# Patient Record
Sex: Male | Born: 1980 | Race: Black or African American | Hispanic: No | Marital: Single | Smoking: Current every day smoker
Health system: Southern US, Community
[De-identification: ages and names within clinical notes are randomized; demographics above are authoritative.]

## PROBLEM LIST (undated history)

## (undated) DIAGNOSIS — B20 Human immunodeficiency virus [HIV] disease: Secondary | ICD-10-CM

## (undated) DIAGNOSIS — F319 Bipolar disorder, unspecified: Secondary | ICD-10-CM

## (undated) DIAGNOSIS — F209 Schizophrenia, unspecified: Secondary | ICD-10-CM

## (undated) DIAGNOSIS — G8190 Hemiplegia, unspecified affecting unspecified side: Secondary | ICD-10-CM

## (undated) DIAGNOSIS — G8929 Other chronic pain: Secondary | ICD-10-CM

## (undated) DIAGNOSIS — M549 Dorsalgia, unspecified: Secondary | ICD-10-CM

## (undated) DIAGNOSIS — R269 Unspecified abnormalities of gait and mobility: Secondary | ICD-10-CM

## (undated) DIAGNOSIS — Z21 Asymptomatic human immunodeficiency virus [HIV] infection status: Secondary | ICD-10-CM

## (undated) HISTORY — PX: ANKLE SURGERY: SHX546

## (undated) HISTORY — DX: Unspecified abnormalities of gait and mobility: R26.9

## (undated) HISTORY — DX: Hemiplegia, unspecified affecting unspecified side: G81.90

## (undated) HISTORY — DX: Asymptomatic human immunodeficiency virus (hiv) infection status: Z21

## (undated) HISTORY — DX: Human immunodeficiency virus (HIV) disease: B20

## (undated) HISTORY — PX: BACK SURGERY: SHX140

---

## 2003-06-07 ENCOUNTER — Emergency Department (HOSPITAL_COMMUNITY): Admission: EM | Admit: 2003-06-07 | Discharge: 2003-06-07 | Payer: Self-pay | Admitting: Emergency Medicine

## 2003-06-07 ENCOUNTER — Encounter: Payer: Self-pay | Admitting: Emergency Medicine

## 2009-06-07 ENCOUNTER — Ambulatory Visit: Payer: Self-pay | Admitting: Family Medicine

## 2009-07-04 ENCOUNTER — Encounter: Payer: Self-pay | Admitting: Internal Medicine

## 2009-07-04 ENCOUNTER — Encounter (INDEPENDENT_AMBULATORY_CARE_PROVIDER_SITE_OTHER): Payer: Self-pay | Admitting: *Deleted

## 2009-07-04 ENCOUNTER — Ambulatory Visit: Payer: Self-pay | Admitting: Internal Medicine

## 2009-07-04 LAB — CONVERTED CEMR LAB
ALT: 36 units/L (ref 0–53)
AST: 25 units/L (ref 0–37)
Albumin: 4.1 g/dL (ref 3.5–5.2)
CD4 T Helper %: 39 % (ref 32–62)
Calcium: 9.2 mg/dL (ref 8.4–10.5)
Chloride: 109 meq/L (ref 96–112)
Creatinine, Ser: 1.14 mg/dL (ref 0.40–1.50)
GC Probe Amp, Urine: NEGATIVE
HIV-1 RNA Quant, Log: <1.68 (ref <1.68)
Lymphocytes Relative: 42 % (ref 12–46)
Lymphs Abs: 1.9 10*3/uL (ref 0.7–4.0)
Neutrophils Relative %: 42 % — ABNORMAL LOW (ref 43–77)
Platelets: 239 10*3/uL (ref 150–400)
Potassium: 4.1 meq/L (ref 3.5–5.3)
Total lymphocyte count: 1848 cells/mcL (ref 700–3300)
WBC: 4.4 10*3/uL (ref 4.0–10.5)

## 2009-07-20 ENCOUNTER — Ambulatory Visit: Payer: Self-pay | Admitting: Internal Medicine

## 2009-07-20 ENCOUNTER — Encounter: Payer: Self-pay | Admitting: Internal Medicine

## 2009-07-20 DIAGNOSIS — G8190 Hemiplegia, unspecified affecting unspecified side: Secondary | ICD-10-CM | POA: Insufficient documentation

## 2009-07-20 DIAGNOSIS — B20 Human immunodeficiency virus [HIV] disease: Secondary | ICD-10-CM

## 2009-08-01 ENCOUNTER — Encounter: Payer: Self-pay | Admitting: Internal Medicine

## 2009-08-02 ENCOUNTER — Encounter: Payer: Self-pay | Admitting: Internal Medicine

## 2009-11-09 ENCOUNTER — Encounter: Payer: Self-pay | Admitting: Internal Medicine

## 2009-11-09 ENCOUNTER — Ambulatory Visit: Payer: Self-pay | Admitting: Family Medicine

## 2009-11-09 ENCOUNTER — Telehealth: Payer: Self-pay | Admitting: Internal Medicine

## 2009-11-09 LAB — CONVERTED CEMR LAB
Basophils Absolute: 0 10*3/uL (ref 0.0–0.1)
Basophils Relative: 0 % (ref 0–1)
Eosinophils Absolute: 0.1 10*3/uL (ref 0.0–0.7)
Eosinophils Relative: 2 % (ref 0–5)
GC Probe Amp, Genital: NEGATIVE
HCT: 45 % (ref 39.0–52.0)
Lymphocytes Relative: 24 % (ref 12–46)
MCHC: 33.3 g/dL (ref 30.0–36.0)
MCV: 97.2 fL (ref 78.0–100.0)
Platelets: 221 10*3/uL (ref 150–400)
RDW: 12.9 % (ref 11.5–15.5)

## 2009-11-16 ENCOUNTER — Ambulatory Visit: Payer: Self-pay | Admitting: Internal Medicine

## 2009-11-16 ENCOUNTER — Encounter: Payer: Self-pay | Admitting: Internal Medicine

## 2009-11-16 DIAGNOSIS — F17200 Nicotine dependence, unspecified, uncomplicated: Secondary | ICD-10-CM | POA: Insufficient documentation

## 2009-11-16 LAB — CONVERTED CEMR LAB
AST: 25 units/L (ref 0–37)
Albumin: 4 g/dL (ref 3.5–5.2)
Alkaline Phosphatase: 57 units/L (ref 39–117)
BUN: 17 mg/dL (ref 6–23)
Basophils Absolute: 0 10*3/uL (ref 0.0–0.1)
CD4 T Helper %: 37 % (ref 32–62)
Calcium: 9.2 mg/dL (ref 8.4–10.5)
Creatinine, Ser: 1.16 mg/dL (ref 0.40–1.50)
Eosinophils Absolute: 0.1 10*3/uL (ref 0.0–0.7)
Eosinophils Relative: 2 % (ref 0–5)
Glucose, Bld: 98 mg/dL (ref 70–99)
HCT: 44 % (ref 39.0–52.0)
HIV-1 RNA Quant, Log: 1.71 — ABNORMAL HIGH (ref <1.68)
Hemoglobin: 14.5 g/dL (ref 13.0–17.0)
MCHC: 33 g/dL (ref 30.0–36.0)
MCV: 99.3 fL (ref 78.0–100.0)
Monocytes Absolute: 0.6 10*3/uL (ref 0.1–1.0)
Platelets: 152 10*3/uL (ref 150–400)
RDW: 13.3 % (ref 11.5–15.5)
Total lymphocyte count: 2183 cells/mcL (ref 700–3300)

## 2009-11-30 ENCOUNTER — Encounter: Payer: Self-pay | Admitting: Internal Medicine

## 2009-11-30 ENCOUNTER — Ambulatory Visit: Payer: Self-pay | Admitting: Internal Medicine

## 2009-11-30 DIAGNOSIS — L0233 Carbuncle of buttock: Secondary | ICD-10-CM

## 2010-01-05 ENCOUNTER — Encounter: Payer: Self-pay | Admitting: Internal Medicine

## 2010-04-03 ENCOUNTER — Telehealth: Payer: Self-pay | Admitting: Internal Medicine

## 2010-04-05 ENCOUNTER — Ambulatory Visit: Payer: Self-pay | Admitting: Internal Medicine

## 2010-04-05 LAB — CONVERTED CEMR LAB
Albumin: 4.4 g/dL (ref 3.5–5.2)
BUN: 15 mg/dL (ref 6–23)
Basophils Absolute: 0 10*3/uL (ref 0.0–0.1)
Calcium: 9.1 mg/dL (ref 8.4–10.5)
Chloride: 106 meq/L (ref 96–112)
Eosinophils Relative: 1 % (ref 0–5)
Glucose, Bld: 94 mg/dL (ref 70–99)
HIV-1 RNA Quant, Log: <1.68 (ref <1.68)
Lymphocytes Relative: 43 % (ref 12–46)
Lymphs Abs: 2.2 10*3/uL (ref 0.7–4.0)
Monocytes Absolute: 0.6 10*3/uL (ref 0.1–1.0)
Potassium: 3.9 meq/L (ref 3.5–5.3)
RBC: 4.4 M/uL (ref 4.22–5.81)
RDW: 13.3 % (ref 11.5–15.5)
WBC: 5 10*3/uL (ref 4.0–10.5)

## 2010-04-07 ENCOUNTER — Emergency Department (HOSPITAL_COMMUNITY): Admission: EM | Admit: 2010-04-07 | Discharge: 2010-04-07 | Payer: Self-pay | Admitting: Family Medicine

## 2010-05-08 ENCOUNTER — Emergency Department (HOSPITAL_COMMUNITY): Admission: EM | Admit: 2010-05-08 | Discharge: 2010-05-08 | Payer: Self-pay | Admitting: Emergency Medicine

## 2010-06-06 ENCOUNTER — Encounter: Payer: Self-pay | Admitting: Internal Medicine

## 2010-07-26 ENCOUNTER — Ambulatory Visit: Payer: Self-pay | Admitting: Internal Medicine

## 2010-07-26 LAB — CONVERTED CEMR LAB
ALT: 23 units/L (ref 0–53)
Albumin: 4.5 g/dL (ref 3.5–5.2)
Alkaline Phosphatase: 71 units/L (ref 39–117)
Bilirubin Urine: NEGATIVE
CO2: 25 meq/L (ref 19–32)
Chlamydia, Swab/Urine, PCR: NEGATIVE
Eosinophils Absolute: 0.1 10*3/uL (ref 0.0–0.7)
Glucose, Bld: 95 mg/dL (ref 70–99)
HIV-1 RNA Quant, Log: <1.3 (ref <1.30)
Ketones, ur: NEGATIVE mg/dL
Lymphocytes Relative: 43 % (ref 12–46)
Lymphs Abs: 2.3 10*3/uL (ref 0.7–4.0)
Neutrophils Relative %: 42 % — ABNORMAL LOW (ref 43–77)
Platelets: 243 10*3/uL (ref 150–400)
Potassium: 4.4 meq/L (ref 3.5–5.3)
Sodium: 141 meq/L (ref 135–145)
Specific Gravity, Urine: 1.023 (ref 1.005–1.030)
Total Protein: 7.2 g/dL (ref 6.0–8.3)
Urine Glucose: NEGATIVE mg/dL
Urobilinogen, UA: 0.2 (ref 0.0–1.0)
WBC: 5.4 10*3/uL (ref 4.0–10.5)

## 2010-08-29 ENCOUNTER — Ambulatory Visit: Payer: Self-pay | Admitting: Adult Health

## 2010-08-29 DIAGNOSIS — J329 Chronic sinusitis, unspecified: Secondary | ICD-10-CM | POA: Insufficient documentation

## 2010-09-03 ENCOUNTER — Emergency Department (HOSPITAL_COMMUNITY): Admission: EM | Admit: 2010-09-03 | Discharge: 2010-09-03 | Payer: Self-pay | Admitting: Emergency Medicine

## 2010-12-11 NOTE — Assessment & Plan Note (Signed)
Summary: Joel pt. checkup [mkj]   CC:  follow-up visit and lab results.  History of Present Illness: Pt here for f/u.  His muscle spasms have continued on the Baclofen that the Neurologist gave him.  He needs to call for a f/u appt to discuss other options.  Preventive Screening-Counseling & Management  Alcohol-Tobacco     Alcohol drinks/day: occasional     Smoking Cessation Counseling: yes     Packs/Day: <0.25     Year Started: 2002     Passive Smoke Exposure: Yes     Passive Smoke Counseling: to avoid passive smoke exposure  Caffeine-Diet-Exercise     Caffeine use/day: soda and coffee     Does Patient Exercise: yes     Type of exercise: walking     Exercise (avg: min/session): >60     Times/week: 7  Safety-Violence-Falls     Seat Belt Use: yes      Sexual History:  n/a.        Drug Use:  Yes.    Comments: pt given condoms   Updated Prior Medication List: ATRIPLA 600-200-300 MG TABS (EFAVIRENZ-EMTRICITAB-TENOFOVIR) Take 1 tablet by mouth at bedtime VALTREX 1 GM TABS (VALACYCLOVIR HCL) Take 1 tablet by mouth once a day BACLOFEN 20 MG TABS (BACLOFEN) as directed by neurology  Current Allergies (reviewed today): No known allergies  Past History:  Past Medical History: Last updated: 07/20/2009 HIV disease  Social History: Sexual History:  n/a  Review of Systems  The patient denies anorexia, fever, and weight loss.    Vital Signs:  Patient profile:   30 year old male Height:      71 inches (180.34 cm) Weight:      229.12 pounds (104.15 kg) BMI:     32.07 Temp:     98.3 degrees F (36.83 degrees C) oral Pulse rate:   87 / minute BP sitting:   120 / 81  (right arm)  Vitals Entered By: Wendall Mola CMA Duncan Dull) (Apr 05, 2010 10:32 AM) CC: follow-up visit, lab results Is Patient Diabetic? No Pain Assessment Patient in pain? yes     Location: back and hip Intensity: 7 Type: sharp Onset of pain  Intermittent Nutritional Status BMI of 25 - 29 =  overweight Nutritional Status Detail appetite "up and down"  Have you ever been in a relationship where you felt threatened, hurt or afraid?No   Does patient need assistance? Functional Status Self care Ambulation Normal Comments pt. missed 5-6 doses of meds since last visit   Physical Exam  General:  alert, well-developed, and well-nourished.   Head:  normocephalic and atraumatic.   Mouth:  pharynx pink and moist.   Lungs:  normal breath sounds.          Medication Adherence: 04/05/2010   Adherence to medications reviewed with patient. Counseling to provide adequate adherence provided   Prevention For Positives: 04/05/2010   Safe sex practices discussed with patient. Condoms offered.                             Impression & Recommendations:  Problem # 1:  HIV DISEASE (ICD-042) Will obtain labs today and have him f/u in 2 weeks for results. Diagnostics Reviewed:  WBC: 5.9 (11/16/2009)   Hgb: 14.5 (11/16/2009)   HCT: 44.0 (11/16/2009)   Platelets: 152 (11/16/2009) HIV-1 RNA: 51 (11/16/2009)   HBSAg: NEG (07/04/2009)  Medications Added to Medication List This Visit: 1)  Baclofen 20 Mg Tabs (Baclofen) .... As directed by neurology  Other Orders: T-CD4SP [facility]) (CD4SP) T-HIV Viral Load [phone]) T-Comprehensive Metabolic Panel [phone]) T-CBC w/Diff ([phone]) Est. Patient Level III (02725)  Patient Instructions: 1)  Please schedule a follow-up appointment in 2 weeks.
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
## 2010-12-11 NOTE — Miscellaneous (Signed)
Summary: RW Update  Clinical Lists Changes  Observations: Added new observation of DATE1STVISIT: 04/05/2010 (06/06/2010 12:24) Added new observation of RWPARTICIP: Yes (06/06/2010 12:24)

## 2010-12-11 NOTE — Miscellaneous (Signed)
Summary: Lab orders  Clinical Lists Changes  Orders: Added new Test order of T-Chlamydia  Probe, urine [phone]) - Signed Added new Test order of T-CBC w/Diff [phone]) - Signed Added new Test order of T-GC Probe, urine [phone]) - Signed Added new Test order of T-Comprehensive Metabolic Panel [phone]) - Signed Added new Test order of T-HIV Viral Load [phone]) - Signed Added new Test order of T-Urinalysis ([id number]) - Signed Added new Test order of T-RPR (Syphilis) ([id number]) - Signed Added new Test order of T-CD4SP ([facility]) (CD4SP) - Signed

## 2010-12-11 NOTE — Assessment & Plan Note (Signed)
Summary: diarrahea X3wks votiming [mkj]   History of Present Illness: 1-week h/o sinus drainage with excessive "phlegm" in his throat started taking left-over amoxicillin (did not recall the dose), but ran out after three days. Still c/o sinus congestion and post nasal drainage.  Denies current fever, chills, or sweats.  Requesting update on immunizations and wanting lab reports from blood draw in 09/11.   Current Allergies: No known allergies  Past History:  Past medical, surgical, family and social histories (including risk factors) reviewed for relevance to current acute and chronic problems.  Past Medical History: Reviewed history from 07/20/2009 and no changes required. HIV disease  Family History: Reviewed history and no changes required.  Social History: Reviewed history and no changes required.  Review of Systems General:  Denies chills, fatigue, fever, loss of appetite, malaise, sleep disorder, sweats, weakness, and weight loss. Eyes:  Denies blurring, discharge, double vision, eye irritation, eye pain, halos, itching, light sensitivity, red eye, vision loss-1 eye, and vision loss-both eyes. ENT:  Complains of nasal congestion and postnasal drainage; denies decreased hearing, difficulty swallowing, ear discharge, earache, hoarseness, nosebleeds, ringing in ears, sinus pressure, and sore throat;  States was to have oral surgery on molars with extraction, missed appointment and says pain aggravates sinus symptoms.. CV:  Denies bluish discoloration of lips or nails, chest pain or discomfort, difficulty breathing at night, difficulty breathing while lying down, fainting, fatigue, leg cramps with exertion, lightheadness, near fainting, palpitations, shortness of breath with exertion, swelling of feet, swelling of hands, and weight gain. Resp:  Denies chest discomfort, chest pain with inspiration, cough, coughing up blood, excessive snoring, hypersomnolence, morning headaches,  pleuritic, shortness of breath, sputum productive, and wheezing. GI:  Denies abdominal pain, bloody stools, change in bowel habits, constipation, dark tarry stools, diarrhea, excessive appetite, gas, hemorrhoids, indigestion, loss of appetite, nausea, vomiting, vomiting blood, and yellowish skin color. GU:  Denies decreased libido, discharge, dysuria, erectile dysfunction, genital sores, hematuria, incontinence, nocturia, urinary frequency, and urinary hesitancy. Derm:  Denies changes in color of skin, changes in nail beds, dryness, excessive perspiration, flushing, hair loss, insect bite(s), itching, lesion(s), poor wound healing, and rash. Neuro:  Complains of weakness; denies brief paralysis, difficulty with concentration, disturbances in coordination, falling down, headaches, inability to speak, memory loss, numbness, poor balance, seizures, sensation of room spinning, tingling, tremors, and visual disturbances; Chronic left sided weakness.  Physical Exam  General:  alert, well-developed, well-hydrated, appropriate dress, normal appearance, healthy-appearing, cooperative to examination, good hygiene, and overweight-appearing.   Head:  Normocephalic and atraumatic without obvious abnormalities. No apparent alopecia or balding. Ears:  External ear exam shows no significant lesions or deformities.  Otoscopic examination reveals clear canals, tympanic membranes are intact bilaterally without bulging, retraction, inflammation or discharge. Hearing is grossly normal bilaterally. Nose:  External nasal examination shows no deformity or inflammation. Nasal mucosa are pink and moist without lesions or exudates. Mouth:  pharynx pink and moist, fair dentition, multiple caries and fillings, and postnasal drip.   Neck:  No deformities, masses, or tenderness noted. Chest Wall:  no deformities, no tenderness, and no masses.   Breasts:  gynecomastia.   Lungs:  Normal respiratory effort, chest expands  symmetrically. Lungs are clear to auscultation, no crackles or wheezes. Heart:  Normal rate and regular rhythm. S1 and S2 normal without gallop, murmur, click, rub or other extra sounds. Abdomen:  Bowel sounds positive,abdomen soft and non-tender without masses, organomegaly or hernias noted. Msk:  No deformity or scoliosis noted of thoracic  or lumbar spine.   Pulses:  R and L carotid,radial,femoral,dorsalis pedis and posterior tibial pulses are full and equal bilaterally Extremities:  No clubbing, cyanosis, edema, or deformity noted with normal full range of motion of all joints.   Neurologic:  alert & oriented X3, cranial nerves II-XII intact, strength normal in all extremities, sensation intact to light touch, sensation intact to pinprick, and gait normal.   Skin:  Intact without suspicious lesions or rashes Cervical Nodes:  [employer] A [employer] P Cervical nodes Psych:  Cognition and judgment appear intact. Alert and cooperative with normal attention span and concentration. No apparent delusions, illusions, hallucinations   Impression & Recommendations:  Problem # 1:  SINUSITIS (ICD-473.9) Resolving.  Will complete antibiotic therapy with Augmentin.  Enmcourage use of Mucinex and Zyrtec for sinus congestion relief. If fever develops, should contact clinic. His updated medication list for this problem includes:    Augmentin 875-125 Mg Tabs (Amoxicillin-pot clavulanate) .Marland Kitchen... 1 tab by mouth every 12 hours for 5 days    Mucinex 600 Mg Xr12h-tab (Guaifenesin) .Marland Kitchen... 1 tablet every 6 hours as needed  Problem # 2:  HIV DISEASE (ICD-042) CD4 690 @ 31%  VL  <20 copies/ml Lab report given to patient with instructions to continue follow-up with Dr. Drue Second. Repeat labs in December 2011 with f/u in January 2012. Plans to continue present regimen of Atripla. His updated medication list for this problem includes:    Valtrex 1 Gm Tabs (Valacyclovir hcl) .Marland Kitchen... Take 1 tablet by mouth once a day    Augmentin  875-125 Mg Tabs (Amoxicillin-pot clavulanate) .Marland Kitchen... 1 tab by mouth every 12 hours for 5 days  Medications Added to Medication List This Visit: 1)  Augmentin 875-125 Mg Tabs (Amoxicillin-pot clavulanate) .Marland Kitchen.. 1 tab by mouth every 12 hours for 5 days 2)  Mucinex 600 Mg Xr12h-tab (Guaifenesin) .Marland Kitchen.. 1 tablet every 6 hours as needed 3)  Zyrtec Allergy 10 Mg Caps (Cetirizine hcl) .Marland Kitchen.. 1 by mouth once daily as needed  Patient Instructions: 1)  Recommend increasing fluid intake for hydration for the next few days. 2)  Take antibiotics until completely gone 3)  May take OTC Zyrtec and Mucinex 4)  Reschedule follow-up with oral surgery 5)  Keep regular follow-up with Dr. Drue Second 6)  We will be holding flu vaccine until antibiotic course is complete. Prescriptions: ZYRTEC ALLERGY 10 MG CAPS (CETIRIZINE HCL) 1 by mouth once daily as needed  #50 x 0   Entered and Authorized by:   Talmadge Chad NP   Signed by:   Talmadge Chad NP on 08/29/2010   Method used:   Print then Give to Patient   RxID:   [id number] MUCINEX 600 MG XR12H-TAB (GUAIFENESIN) 1 tablet every 6 hours as needed  #50 x 0   Entered and Authorized by:   Talmadge Chad NP   Signed by:   Talmadge Chad NP on 08/29/2010   Method used:   Print then Give to Patient   RxID:   [id number] AUGMENTIN 875-125 MG TABS (AMOXICILLIN-POT CLAVULANATE) 1 tab by mouth every 12 hours for 5 days  #10 x 0   Entered and Authorized by:   Talmadge Chad NP   Signed by:   Talmadge Chad NP on 08/29/2010   Method used:   Print then Give to Patient   RxID:   [id number]
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
## 2010-12-11 NOTE — Letter (Signed)
Summary: [facility]  [facility]   Imported By: Percell Miller 01/05/2010 15:30:12  _____________________________________________________________________  External Attachment:    Type:   Image     Comment:   External Document

## 2010-12-11 NOTE — Progress Notes (Signed)
Summary: requests refill  Phone Note Call from Patient   Caller: Patient [phone] Reason for Call: Refill Medication Details for Reason: requests refill Summary of Call: pt. will be out of Atripla and Valtrex after this evening.  Has appt. this Thursday 04/05/10. Do you want to refill or wait until appt.? Initial call taken by: Wendall Mola CMA Duncan Dull),  Apr 03, 2010 12:36 PM  Follow-up for Phone Call        ok to refill Follow-up by: Yisroel Ramming MD,  Apr 03, 2010 3:50 PM    Prescriptions: ATRIPLA 600-200-300 MG TABS (EFAVIRENZ-EMTRICITAB-TENOFOVIR) Take 1 tablet by mouth at bedtime  #30 x 5   Entered by:   Wendall Mola CMA ( AAMA)   Authorized by:   Yisroel Ramming MD   Signed by:   Wendall Mola CMA ( AAMA) on 04/03/2010   Method used:   Telephoned to ...       [facility] (retail)       [address]       [place], [place]  [id number]       Ph: [phone]       Fax: [phone]   RxID:   [id number] VALTREX 1 GM TABS (VALACYCLOVIR HCL) Take 1 tablet by mouth once a day  #30 x 5   Entered by:   Wendall Mola CMA ( AAMA)   Authorized by:   Yisroel Ramming MD   Signed by:   Wendall Mola CMA ( AAMA) on 04/03/2010   Method used:   Telephoned to ...       [facility] (retail)       [address]       [place], [place]  [id number]       Ph: [phone]       Fax: [phone]   RxID:   [id number]

## 2010-12-11 NOTE — Miscellaneous (Signed)
  Clinical Lists Changes  Orders: Added new Service order of Est. Patient Level IV (99214) - Signed 

## 2010-12-11 NOTE — Miscellaneous (Signed)
Summary: HIPAA Restrictions  HIPAA Restrictions   Imported By: Florinda Marker 04/06/2010 11:03:28  _____________________________________________________________________  External Attachment:    Type:   Image     Comment:   External Document

## 2010-12-23 ENCOUNTER — Emergency Department (HOSPITAL_COMMUNITY): Payer: Medicare Other

## 2010-12-23 ENCOUNTER — Emergency Department (HOSPITAL_COMMUNITY)
Admission: EM | Admit: 2010-12-23 | Discharge: 2010-12-23 | Disposition: A | Discharge status: Home / Self Care | Payer: Medicare Other | Attending: Emergency Medicine | Admitting: Emergency Medicine

## 2010-12-23 DIAGNOSIS — X58XXXA Exposure to other specified factors, initial encounter: Secondary | ICD-10-CM | POA: Insufficient documentation

## 2010-12-23 DIAGNOSIS — F319 Bipolar disorder, unspecified: Secondary | ICD-10-CM | POA: Insufficient documentation

## 2010-12-23 DIAGNOSIS — Y929 Unspecified place or not applicable: Secondary | ICD-10-CM | POA: Insufficient documentation

## 2010-12-23 DIAGNOSIS — M79609 Pain in unspecified limb: Secondary | ICD-10-CM | POA: Insufficient documentation

## 2010-12-23 DIAGNOSIS — M7989 Other specified soft tissue disorders: Secondary | ICD-10-CM | POA: Insufficient documentation

## 2010-12-23 DIAGNOSIS — IMO0002 Reserved for concepts with insufficient information to code with codable children: Secondary | ICD-10-CM | POA: Insufficient documentation

## 2010-12-23 DIAGNOSIS — Z21 Asymptomatic human immunodeficiency virus [HIV] infection status: Secondary | ICD-10-CM | POA: Insufficient documentation

## 2010-12-23 DIAGNOSIS — L02619 Cutaneous abscess of unspecified foot: Secondary | ICD-10-CM | POA: Insufficient documentation

## 2010-12-23 DIAGNOSIS — L03039 Cellulitis of unspecified toe: Secondary | ICD-10-CM | POA: Insufficient documentation

## 2010-12-23 DIAGNOSIS — Z79899 Other long term (current) drug therapy: Secondary | ICD-10-CM | POA: Insufficient documentation

## 2011-01-08 ENCOUNTER — Encounter (INDEPENDENT_AMBULATORY_CARE_PROVIDER_SITE_OTHER): Payer: Self-pay | Admitting: *Deleted

## 2011-01-09 ENCOUNTER — Other Ambulatory Visit: Payer: Self-pay

## 2011-01-16 ENCOUNTER — Other Ambulatory Visit (INDEPENDENT_AMBULATORY_CARE_PROVIDER_SITE_OTHER): Payer: Medicare Other

## 2011-01-16 ENCOUNTER — Encounter: Payer: Self-pay | Admitting: Adult Health

## 2011-01-16 ENCOUNTER — Other Ambulatory Visit: Payer: Self-pay | Admitting: Adult Health

## 2011-01-16 ENCOUNTER — Encounter (INDEPENDENT_AMBULATORY_CARE_PROVIDER_SITE_OTHER): Payer: Self-pay | Admitting: Licensed Clinical Social Worker

## 2011-01-16 LAB — CONVERTED CEMR LAB
ALT: 20 units/L (ref 0–53)
AST: 24 units/L (ref 0–37)
Basophils Absolute: 0 10*3/uL (ref 0.0–0.1)
CO2: 22 meq/L (ref 19–32)
Calcium: 9.3 mg/dL (ref 8.4–10.5)
Chloride: 106 meq/L (ref 96–112)
Creatinine, Ser: 1.3 mg/dL (ref 0.40–1.50)
Eosinophils Absolute: 0.2 10*3/uL (ref 0.0–0.7)
Eosinophils Relative: 3 % (ref 0–5)
HCT: 46.1 % (ref 39.0–52.0)
HIV 1 RNA Quant: <20 copies/mL (ref <20)
HIV-1 RNA Quant, Log: <1.3 (ref <1.30)
Lymphs Abs: 2.2 10*3/uL (ref 0.7–4.0)
MCV: 97.9 fL (ref 78.0–100.0)
Neutrophils Relative %: 49 % (ref 43–77)
Platelets: 241 10*3/uL (ref 150–400)
RDW: 13.3 % (ref 11.5–15.5)
Sodium: 141 meq/L (ref 135–145)
Total Bilirubin: 0.5 mg/dL (ref 0.3–1.2)
Total Protein: 7 g/dL (ref 6.0–8.3)
WBC: 5.6 10*3/uL (ref 4.0–10.5)

## 2011-01-17 DIAGNOSIS — B20 Human immunodeficiency virus [HIV] disease: Secondary | ICD-10-CM

## 2011-01-17 LAB — T-HELPER CELL (CD4) - (RCID CLINIC ONLY)
CD4 % Helper T Cell: 35 % (ref 33–55)
CD4 T Cell Abs: 780 uL (ref 400–2700)

## 2011-01-17 NOTE — Miscellaneous (Signed)
Summary: medication update  Clinical Lists Changes

## 2011-01-22 ENCOUNTER — Ambulatory Visit: Payer: Self-pay | Admitting: Adult Health

## 2011-01-22 NOTE — Miscellaneous (Signed)
Summary: Orders Update  Clinical Lists Changes  Orders: Added new Test order of T-CD4SP [facility]) (CD4SP) - Signed Added new Test order of T-Comprehensive Metabolic Panel [phone]) - Signed Added new Test order of T-HIV Viral Load [phone]) - Signed

## 2011-01-23 LAB — BASIC METABOLIC PANEL
CO2: 23 mEq/L (ref 19–32)
Calcium: 9 mg/dL (ref 8.4–10.5)
Creatinine, Ser: 0.99 mg/dL (ref 0.4–1.5)
GFR calc Af Amer: >60 mL/min (ref >60)
GFR calc non Af Amer: >60 mL/min (ref >60)
Sodium: 137 mEq/L (ref 135–145)

## 2011-01-23 LAB — DIFFERENTIAL
Lymphocytes Relative: 33 % (ref 12–46)
Lymphs Abs: 2.1 10*3/uL (ref 0.7–4.0)
Monocytes Relative: 13 % — ABNORMAL HIGH (ref 3–12)
Neutro Abs: 3.3 10*3/uL (ref 1.7–7.7)
Neutrophils Relative %: 51 % (ref 43–77)

## 2011-01-23 LAB — CBC
Hemoglobin: 14.5 g/dL (ref 13.0–17.0)
Platelets: 238 10*3/uL (ref 150–400)
RBC: 4.63 MIL/uL (ref 4.22–5.81)
WBC: 6.4 10*3/uL (ref 4.0–10.5)

## 2011-01-24 LAB — T-HELPER CELL (CD4) - (RCID CLINIC ONLY)
CD4 % Helper T Cell: 31 % — ABNORMAL LOW (ref 33–55)
CD4 T Cell Abs: 690 uL (ref 400–2700)

## 2011-01-31 ENCOUNTER — Ambulatory Visit (INDEPENDENT_AMBULATORY_CARE_PROVIDER_SITE_OTHER): Payer: Medicare Other | Admitting: Adult Health

## 2011-01-31 ENCOUNTER — Encounter: Payer: Self-pay | Admitting: Adult Health

## 2011-01-31 ENCOUNTER — Ambulatory Visit: Payer: Self-pay | Admitting: Adult Health

## 2011-01-31 VITALS — BP 120/80 | HR 71 | Temp 98.3°F | Ht 71.0 in | Wt 221.0 lb

## 2011-01-31 DIAGNOSIS — B20 Human immunodeficiency virus [HIV] disease: Secondary | ICD-10-CM

## 2011-02-07 ENCOUNTER — Other Ambulatory Visit: Payer: Medicare Other

## 2011-03-13 NOTE — Progress Notes (Signed)
  Subjective:    Patient ID: Joel Smith, male    DOB: October 12, 1981, 30 y.o.   MRN: [id number]  HPI presents to clinic for followup. Remains adherent to his antiretrovirals. Has no specific complaints today.    Review of Systems  Constitutional: Negative.   HENT: Negative.   Eyes: Negative.   Respiratory: Negative.   Cardiovascular: Negative.   Gastrointestinal: Negative.   Genitourinary: Negative.   Musculoskeletal: Negative.   Skin: Negative.   Neurological: Negative.   Hematological: Negative.   Psychiatric/Behavioral: Negative.        Objective:   Physical Exam  Constitutional: He is oriented to person, place, and time. He appears well-developed and well-nourished. No distress.  HENT:  Head: Normocephalic and atraumatic.  Right Ear: External ear normal.  Left Ear: External ear normal.  Mouth/Throat: Oropharynx is clear and moist. No oropharyngeal exudate.  Eyes: Conjunctivae and EOM are normal. Pupils are equal, round, and reactive to light. Right eye exhibits no discharge. Left eye exhibits no discharge. No scleral icterus.  Neck: Neck supple. No JVD present. No tracheal deviation present. No thyromegaly present.  Cardiovascular: Normal rate, regular rhythm, normal heart sounds and intact distal pulses.  Exam reveals no gallop and no friction rub.   No murmur heard. Pulmonary/Chest: Smith sounds normal. No stridor. No respiratory distress. He has no wheezes. He has no rales. He exhibits no tenderness.  Abdominal: Soft. Bowel sounds are normal. He exhibits no distension and no mass. There is no tenderness. There is no rebound and no guarding.  Musculoskeletal: Normal range of motion. He exhibits no edema and no tenderness.  Lymphadenopathy:    He has no cervical adenopathy.  Neurological: He is alert and oriented to person, place, and time. No cranial nerve deficit. Coordination normal.  Skin: Skin is warm and dry. No rash noted. He is not diaphoretic.  Psychiatric: He  has a normal mood and affect. His behavior is normal. Judgment and thought content normal.          Assessment & Plan:  1. HIV. CD4 count from 01/16/2011 was 780 at 35% with a viral load of less than 20 copies per mL. Clinically stable on Atripla. Recommend continuing present management with repeat labs in 10 weeks and followup in 3 months. Verbally acknowledged this information and agreed with plan of care.

## 2011-04-09 ENCOUNTER — Other Ambulatory Visit: Payer: Self-pay | Admitting: *Deleted

## 2011-04-09 DIAGNOSIS — J383 Other diseases of vocal cords: Secondary | ICD-10-CM

## 2011-04-09 DIAGNOSIS — B977 Papillomavirus as the cause of diseases classified elsewhere: Secondary | ICD-10-CM | POA: Insufficient documentation

## 2011-04-09 MED ORDER — VALACYCLOVIR HCL 1 G PO TABS: 1000.0000 mg | ORAL_TABLET | Freq: Every day | ORAL | Status: DC

## 2011-04-17 ENCOUNTER — Other Ambulatory Visit: Payer: Medicare Other

## 2011-05-02 ENCOUNTER — Ambulatory Visit: Payer: Medicare Other | Admitting: Adult Health

## 2011-08-21 ENCOUNTER — Encounter: Payer: Self-pay | Admitting: *Deleted

## 2011-08-21 ENCOUNTER — Ambulatory Visit: Payer: Medicare Other | Admitting: *Deleted

## 2011-08-21 ENCOUNTER — Other Ambulatory Visit: Payer: Medicare Other

## 2011-08-21 VITALS — BP 111/78 | HR 74 | Temp 97.7°F

## 2011-08-21 DIAGNOSIS — B20 Human immunodeficiency virus [HIV] disease: Secondary | ICD-10-CM

## 2011-08-21 LAB — CBC WITH DIFFERENTIAL/PLATELET
Basophils Absolute: 0 10*3/uL (ref 0.0–0.1)
Basophils Relative: 0 % (ref 0–1)
Eosinophils Absolute: 0.2 10*3/uL (ref 0.0–0.7)
Eosinophils Relative: 3 % (ref 0–5)
HCT: 42.5 % (ref 39.0–52.0)
MCH: 31.7 pg (ref 26.0–34.0)
MCHC: 32.9 g/dL (ref 30.0–36.0)
MCV: 96.2 fL (ref 78.0–100.0)
Monocytes Absolute: 0.5 10*3/uL (ref 0.1–1.0)
Platelets: 237 10*3/uL (ref 150–400)
RDW: 13.1 % (ref 11.5–15.5)

## 2011-08-21 LAB — COMPREHENSIVE METABOLIC PANEL
ALT: 22 U/L (ref 0–53)
AST: 27 U/L (ref 0–37)
Alkaline Phosphatase: 71 U/L (ref 39–117)
BUN: 15 mg/dL (ref 6–23)
Calcium: 9.1 mg/dL (ref 8.4–10.5)
Chloride: 106 mEq/L (ref 96–112)
Creat: 1.3 mg/dL (ref 0.50–1.35)
Total Bilirubin: 0.3 mg/dL (ref 0.3–1.2)

## 2011-08-22 LAB — T-HELPER CELL (CD4) - (RCID CLINIC ONLY)
CD4 % Helper T Cell: 39 % (ref 33–55)
CD4 T Cell Abs: 960 uL (ref 400–2700)

## 2011-09-04 ENCOUNTER — Ambulatory Visit (INDEPENDENT_AMBULATORY_CARE_PROVIDER_SITE_OTHER): Payer: Medicare Other | Admitting: Infectious Diseases

## 2011-09-04 ENCOUNTER — Encounter: Payer: Self-pay | Admitting: Infectious Diseases

## 2011-09-04 DIAGNOSIS — J383 Other diseases of vocal cords: Secondary | ICD-10-CM

## 2011-09-04 DIAGNOSIS — Z113 Encounter for screening for infections with a predominantly sexual mode of transmission: Secondary | ICD-10-CM

## 2011-09-04 DIAGNOSIS — Z79899 Other long term (current) drug therapy: Secondary | ICD-10-CM

## 2011-09-04 DIAGNOSIS — B977 Papillomavirus as the cause of diseases classified elsewhere: Secondary | ICD-10-CM

## 2011-09-04 DIAGNOSIS — Z21 Asymptomatic human immunodeficiency virus [HIV] infection status: Secondary | ICD-10-CM

## 2011-09-04 DIAGNOSIS — B20 Human immunodeficiency virus [HIV] disease: Secondary | ICD-10-CM

## 2011-09-04 DIAGNOSIS — Z23 Encounter for immunization: Secondary | ICD-10-CM

## 2011-09-04 DIAGNOSIS — H539 Unspecified visual disturbance: Secondary | ICD-10-CM | POA: Insufficient documentation

## 2011-09-04 MED ORDER — EFAVIRENZ-EMTRICITAB-TENOFOVIR 600-200-300 MG PO TABS: 1.0000 | ORAL_TABLET | Freq: Every day | ORAL | Status: DC

## 2011-09-04 MED ORDER — VALACYCLOVIR HCL 1 G PO TABS: 1000.0000 mg | ORAL_TABLET | Freq: Every day | ORAL | Status: DC

## 2011-09-04 NOTE — Progress Notes (Signed)
Addended by: Wendall Mola A on: 09/04/2011 03:17 PM   Modules accepted: Orders

## 2011-09-04 NOTE — Assessment & Plan Note (Signed)
Will have him eval by ophtho.  

## 2011-09-04 NOTE — Progress Notes (Signed)
  Subjective:    Patient ID: Joel Smith, male    DOB: October 13, 1981, 30 y.o.   MRN: [id number]  HPI 30 yo M who tested HIV+ 01-2002. He had previously been treated with KLT/CBV and was changed to [place]. He had previously been cared for in [place]. Last CD4 960 and VL <20 (08-21-11).  States he has been seeing floaters in his vision for awhile. Wants to see ophtho.    Review of Systems  Constitutional: Negative for appetite change and unexpected weight change.  Eyes: Positive for visual disturbance.  Gastrointestinal: Negative for diarrhea and constipation.  Genitourinary: Negative for dysuria.       Objective:   Physical Exam  Constitutional: He appears well-developed and well-nourished.  Eyes: EOM are normal. Pupils are equal, round, and reactive to light.  Neck: Neck supple.  Cardiovascular: Normal rate, regular rhythm and normal heart sounds.   Pulmonary/Chest: Effort normal and Smith sounds normal.  Abdominal: Soft. Bowel sounds are normal. There is no tenderness.  Lymphadenopathy:    He has no cervical adenopathy.          Assessment & Plan:

## 2011-09-04 NOTE — Assessment & Plan Note (Signed)
He is doing well, his partner is HIV+ but not on rx. He states they are not using meds. He is given condoms. Gets flu shot today. Will see him back in 4-5 months with labs prior.

## 2011-09-05 LAB — LIPID PANEL
LDL Cholesterol: 131 mg/dL — ABNORMAL HIGH (ref 0–99)
Triglycerides: 75 mg/dL (ref <150)
VLDL: 15 mg/dL (ref 0–40)

## 2011-11-16 ENCOUNTER — Emergency Department (HOSPITAL_COMMUNITY)
Admission: EM | Admit: 2011-11-16 | Discharge: 2011-11-16 | Disposition: A | Discharge status: Home / Self Care | Payer: Medicare Other | Attending: Emergency Medicine | Admitting: Emergency Medicine

## 2011-11-16 ENCOUNTER — Encounter (HOSPITAL_COMMUNITY): Payer: Self-pay | Admitting: Emergency Medicine

## 2011-11-16 DIAGNOSIS — S61209A Unspecified open wound of unspecified finger without damage to nail, initial encounter: Secondary | ICD-10-CM | POA: Diagnosis not present

## 2011-11-16 DIAGNOSIS — Z79899 Other long term (current) drug therapy: Secondary | ICD-10-CM | POA: Diagnosis not present

## 2011-11-16 DIAGNOSIS — Z21 Asymptomatic human immunodeficiency virus [HIV] infection status: Secondary | ICD-10-CM | POA: Diagnosis not present

## 2011-11-16 DIAGNOSIS — S61219A Laceration without foreign body of unspecified finger without damage to nail, initial encounter: Secondary | ICD-10-CM

## 2011-11-16 DIAGNOSIS — W269XXA Contact with unspecified sharp object(s), initial encounter: Secondary | ICD-10-CM | POA: Insufficient documentation

## 2011-11-16 MED ORDER — TETANUS-DIPHTH-ACELL PERTUSSIS 5-2.5-18.5 LF-MCG/0.5 IM SUSP
0.5000 mL | Freq: Once | INTRAMUSCULAR | Status: AC
  Administered 2011-11-16: 0.5 mL via INTRAMUSCULAR
  Filled 2011-11-16: qty 0.5

## 2011-11-16 MED ORDER — OXYCODONE-ACETAMINOPHEN 5-325 MG PO TABS: 1.0000 | ORAL_TABLET | Freq: Four times a day (QID) | ORAL | Status: AC | PRN

## 2011-11-16 NOTE — ED Notes (Signed)
Rx x 2, pt voiced understanding to f/u with PCP, states he has appt on the 9th of this month

## 2011-11-16 NOTE — ED Provider Notes (Signed)
History     CSN: [id number]  Arrival date & time 11/16/11  0024   First MD Initiated Contact with Patient 11/16/11 [phone]      Chief Complaint  Patient presents with  . Laceration    (Consider location/radiation/quality/duration/timing/severity/associated sxs/prior treatment) Patient is a 31 y.o. male presenting with skin laceration. The history is provided by the patient.  Laceration    patient states he cut his left index finger while cutting a bagel chips. His continued to bleed. No other injury. He states it hurts a lot. He does not know his last tetanus shot was. No numbness.  Past Medical History  Diagnosis Date  . HIV infection     History reviewed. No pertinent past surgical history.  Family History  Problem Relation Age of Onset  . Cancer Mother     breast cancer  . Hypertension Father     History  Substance Use Topics  . Smoking status: Current Everyday Smoker -- 0.5 packs/day for 12 years    Types: Cigarettes  . Smokeless tobacco: Never Used  . Alcohol Use: 1.0 oz/week    2 drink(s) per week     occasional      Review of Systems  Musculoskeletal:       Laceration to finger  Skin:       Laceration to finger  Neurological: Negative for numbness.  Hematological: Negative for adenopathy. Does not bruise/bleed easily.    Allergies  Pineapple  Home Medications   Current Outpatient Rx  Name Route Sig Dispense Refill  . BENZTROPINE MESYLATE 0.5 MG PO TABS Oral Take 0.5 mg by mouth 2 (two) times daily. Mental health     . CETIRIZINE HCL 10 MG PO CAPS Oral Take 1 capsule by mouth daily.      . EFAVIRENZ-EMTRICITAB-TENOFOVIR 600-200-300 MG PO TABS Oral Take 1 tablet by mouth at bedtime. 90 tablet 3  . GUAIFENESIN ER 600 MG PO TB12 Oral Take 600 mg by mouth every 6 (six) hours as needed. For congestion    . OXYCODONE-ACETAMINOPHEN 10-325 MG PO TABS Oral Take 1 tablet by mouth every 8 (eight) hours as needed. PCP prescribes     . VALACYCLOVIR HCL 1 G PO  TABS Oral Take 1 tablet (1,000 mg total) by mouth daily. 30 tablet 5  . ZIPRASIDONE HCL 60 MG PO CAPS Oral Take 60 mg by mouth 2 (two) times daily with a meal. Mental health     . OXYCODONE-ACETAMINOPHEN 5-325 MG PO TABS Oral Take 1-2 tablets by mouth every 6 (six) hours as needed for pain. 6 tablet 0    BP 130/88  Pulse 70  Temp(Src) 98.1 F (36.7 C) (Oral)  Resp 17  SpO2 100%  Physical Exam  Constitutional: He appears well-developed and well-nourished.  Musculoskeletal:       1 cm laceration to the lower aspect of had a distal phalanx of left index finger. Sensation intact distally flexion and extension intact.  Neurological: He is alert.    ED Course  LACERATION REPAIR Date/Time: 11/16/2011 6:00 AM Performed by: Benjiman Core R. Authorized by: Billee Cashing Consent: Verbal consent obtained. Written consent not obtained. Risks and benefits: risks, benefits and alternatives were discussed Consent given by: patient Patient understanding: patient states understanding of the procedure being performed Patient consent: the patient's understanding of the procedure matches consent given Procedure consent: procedure consent matches procedure scheduled Relevant documents: relevant documents present and verified Test results: test results available and properly labeled Site marked:  the operative site was marked Required items: required blood products, implants, devices, and special equipment available Patient identity confirmed: verbally with patient Time out: Immediately prior to procedure a "time out" was called to verify the correct patient, procedure, equipment, support staff and site/side marked as required. Body area: upper extremity Location details: left index finger Laceration length: 1 cm Tendon involvement: none Nerve involvement: none Vascular damage: no Anesthesia: local infiltration and digital block Local anesthetic: lidocaine 2% without  epinephrine Anesthetic total: 3 ml Patient sedated: no Preparation: Patient was prepped and draped in the usual sterile fashion. Skin closure: 5-0 nylon Number of sutures: 3 Technique: simple Approximation: close Approximation difficulty: simple Dressing: 4x4 sterile gauze and antibiotic ointment Patient tolerance: Patient tolerated the procedure well with no immediate complications.   (including critical care time)  Labs Reviewed - No data to display No results found.   1. Finger laceration       MDM  Laceration on finger without tendon involvement. Wound closed. Patient requesting pain medication.         Juliet Rude. Rubin Payor, MD 11/16/11 6045

## 2011-11-16 NOTE — ED Notes (Signed)
Pt. Presents with laceration at left index finger , accidentally sliced with knife while cooking.  Dressing applied prior to arrival .

## 2011-11-20 DIAGNOSIS — I10 Essential (primary) hypertension: Secondary | ICD-10-CM | POA: Diagnosis not present

## 2011-11-20 DIAGNOSIS — M549 Dorsalgia, unspecified: Secondary | ICD-10-CM | POA: Diagnosis not present

## 2011-11-20 DIAGNOSIS — L089 Local infection of the skin and subcutaneous tissue, unspecified: Secondary | ICD-10-CM | POA: Diagnosis not present

## 2011-11-20 DIAGNOSIS — F411 Generalized anxiety disorder: Secondary | ICD-10-CM | POA: Diagnosis not present

## 2011-12-03 ENCOUNTER — Encounter (HOSPITAL_COMMUNITY): Payer: Self-pay | Admitting: Emergency Medicine

## 2011-12-03 ENCOUNTER — Emergency Department (INDEPENDENT_AMBULATORY_CARE_PROVIDER_SITE_OTHER)
Admission: EM | Admit: 2011-12-03 | Discharge: 2011-12-03 | Disposition: A | Discharge status: Home / Self Care | Payer: Medicare Other | Source: Home / Self Care | Attending: Family Medicine | Admitting: Family Medicine

## 2011-12-03 DIAGNOSIS — G8929 Other chronic pain: Secondary | ICD-10-CM | POA: Diagnosis not present

## 2011-12-03 HISTORY — DX: Dorsalgia, unspecified: M54.9

## 2011-12-03 HISTORY — DX: Other chronic pain: G89.29

## 2011-12-03 HISTORY — DX: Bipolar disorder, unspecified: F31.9
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
## 2011-12-10 DIAGNOSIS — F319 Bipolar disorder, unspecified: Secondary | ICD-10-CM | POA: Diagnosis not present

## 2011-12-18 DIAGNOSIS — G8929 Other chronic pain: Secondary | ICD-10-CM | POA: Diagnosis not present

## 2011-12-18 DIAGNOSIS — F411 Generalized anxiety disorder: Secondary | ICD-10-CM | POA: Diagnosis not present

## 2011-12-26 DIAGNOSIS — G8110 Spastic hemiplegia affecting unspecified side: Secondary | ICD-10-CM | POA: Diagnosis not present

## 2012-01-15 ENCOUNTER — Other Ambulatory Visit: Payer: Medicare Other

## 2012-01-29 ENCOUNTER — Telehealth: Payer: Self-pay | Admitting: *Deleted

## 2012-01-29 ENCOUNTER — Ambulatory Visit: Payer: Medicare Other | Admitting: Infectious Diseases

## 2012-01-29 NOTE — Telephone Encounter (Signed)
When he returns my call, he needs to reschedule the appt he missed today

## 2012-02-14 DIAGNOSIS — B20 Human immunodeficiency virus [HIV] disease: Secondary | ICD-10-CM | POA: Diagnosis not present

## 2012-02-14 DIAGNOSIS — M545 Low back pain: Secondary | ICD-10-CM | POA: Diagnosis not present

## 2012-02-14 DIAGNOSIS — J45909 Unspecified asthma, uncomplicated: Secondary | ICD-10-CM | POA: Diagnosis not present

## 2012-02-14 DIAGNOSIS — F411 Generalized anxiety disorder: Secondary | ICD-10-CM | POA: Diagnosis not present

## 2012-03-13 DIAGNOSIS — B20 Human immunodeficiency virus [HIV] disease: Secondary | ICD-10-CM | POA: Diagnosis not present

## 2012-03-13 DIAGNOSIS — F411 Generalized anxiety disorder: Secondary | ICD-10-CM | POA: Diagnosis not present

## 2012-03-13 DIAGNOSIS — J45909 Unspecified asthma, uncomplicated: Secondary | ICD-10-CM | POA: Diagnosis not present

## 2012-03-24 DIAGNOSIS — G8110 Spastic hemiplegia affecting unspecified side: Secondary | ICD-10-CM | POA: Diagnosis not present

## 2012-04-07 ENCOUNTER — Ambulatory Visit: Payer: Medicare Other | Admitting: Physical Therapy

## 2012-04-08 ENCOUNTER — Ambulatory Visit: Payer: Medicare Other | Attending: Neurology | Admitting: Physical Therapy

## 2012-04-08 DIAGNOSIS — R269 Unspecified abnormalities of gait and mobility: Secondary | ICD-10-CM | POA: Insufficient documentation

## 2012-04-08 DIAGNOSIS — IMO0001 Reserved for inherently not codable concepts without codable children: Secondary | ICD-10-CM | POA: Diagnosis not present

## 2012-04-11 IMAGING — CR DG TOE GREAT 2+V*L*
3 series · 3 of 3 positions shown · non-contrast
Comparison: None

CLINICAL DATA: Left great toe injury and pain.

LEFT TOE - 2+ VIEW

[t toes ap left]
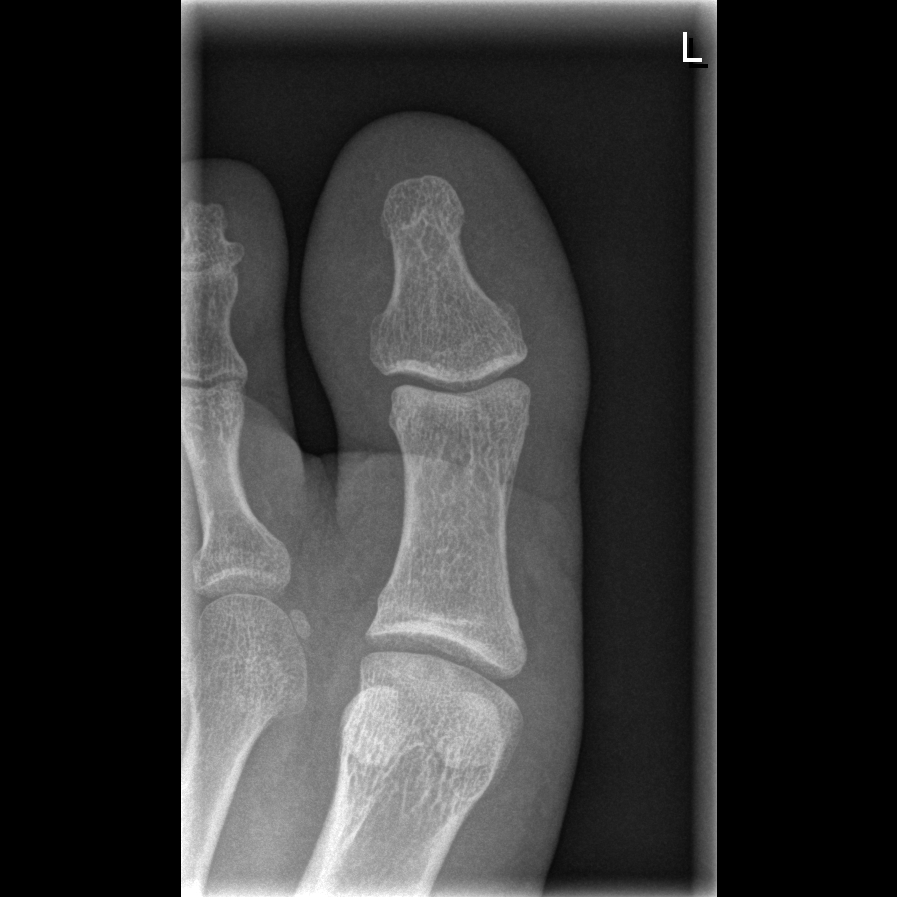

[t toes oblique left]
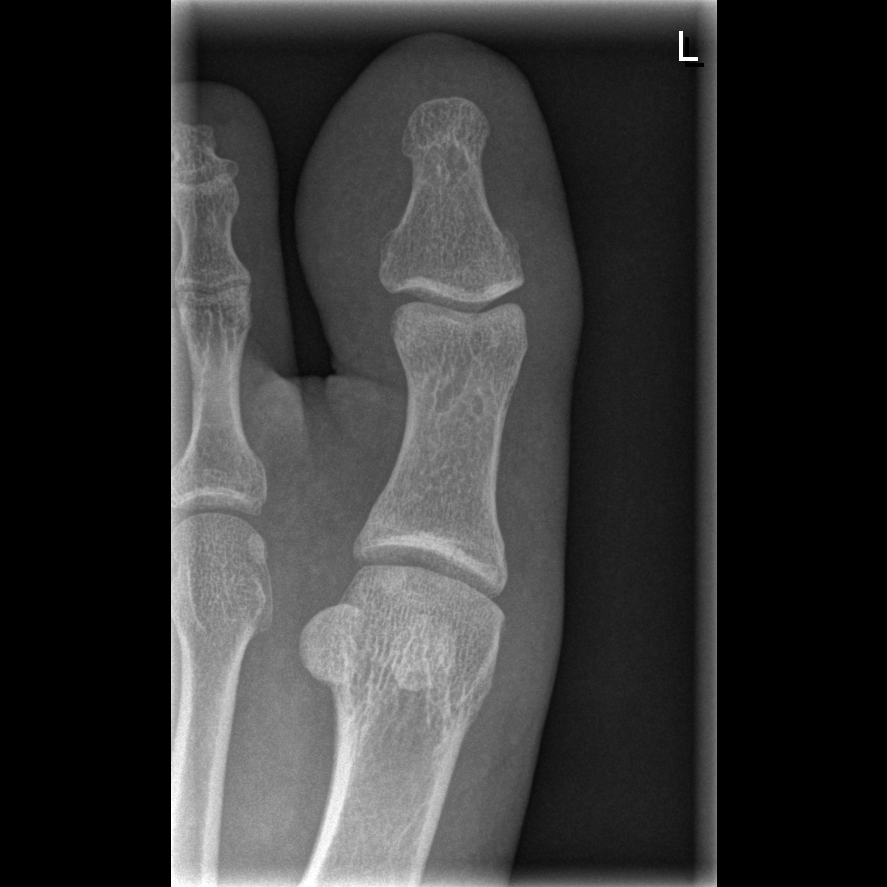

[t toes lateral left *]
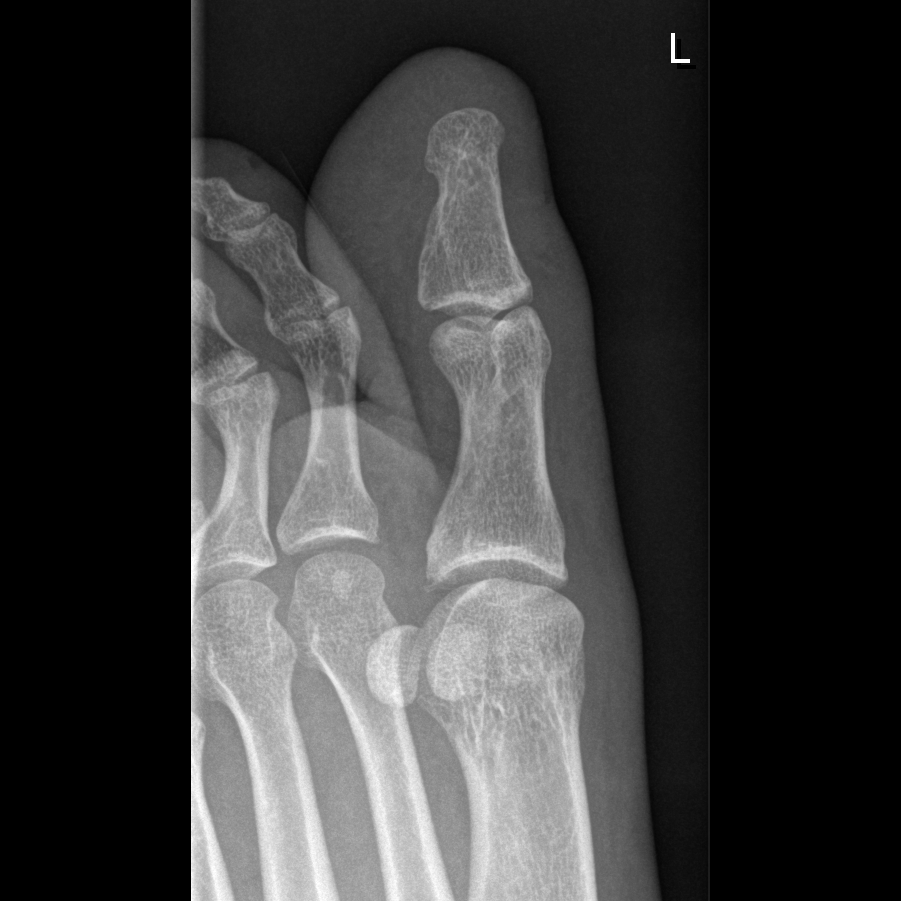

[3 of 3 positions shown; findings below may reference images not displayed]

FINDINGS: No evidence of acute fracture, subluxation or dislocation
identified.

No radio-opaque foreign bodies are present.

No focal bony lesions are noted.

The joint spaces are unremarkable.
IMPRESSION: No acute bony abnormality.

## 2012-04-14 ENCOUNTER — Ambulatory Visit: Payer: Medicare Other | Admitting: Physical Therapy

## 2012-04-15 ENCOUNTER — Ambulatory Visit: Payer: Medicare Other | Admitting: Physical Therapy

## 2012-04-16 ENCOUNTER — Ambulatory Visit: Payer: Medicare Other | Attending: Neurology | Admitting: Physical Therapy

## 2012-04-16 DIAGNOSIS — R269 Unspecified abnormalities of gait and mobility: Secondary | ICD-10-CM | POA: Insufficient documentation

## 2012-04-16 DIAGNOSIS — IMO0001 Reserved for inherently not codable concepts without codable children: Secondary | ICD-10-CM | POA: Insufficient documentation

## 2012-04-17 ENCOUNTER — Ambulatory Visit: Payer: Medicare Other | Admitting: Physical Therapy

## 2012-04-20 ENCOUNTER — Ambulatory Visit: Payer: Medicare Other | Admitting: Physical Therapy

## 2012-04-23 ENCOUNTER — Ambulatory Visit: Payer: Medicare Other | Admitting: Physical Therapy

## 2012-04-28 ENCOUNTER — Ambulatory Visit: Payer: Medicare Other | Admitting: Physical Therapy

## 2012-05-01 ENCOUNTER — Ambulatory Visit: Payer: Medicare Other | Admitting: Physical Therapy

## 2012-05-05 ENCOUNTER — Ambulatory Visit: Payer: Medicare Other | Admitting: Physical Therapy

## 2012-05-07 ENCOUNTER — Ambulatory Visit: Payer: Medicare Other | Admitting: Physical Therapy

## 2012-05-26 DIAGNOSIS — R5383 Other fatigue: Secondary | ICD-10-CM | POA: Diagnosis not present

## 2012-05-26 DIAGNOSIS — B20 Human immunodeficiency virus [HIV] disease: Secondary | ICD-10-CM | POA: Diagnosis not present

## 2012-05-26 DIAGNOSIS — J45909 Unspecified asthma, uncomplicated: Secondary | ICD-10-CM | POA: Diagnosis not present

## 2012-05-26 DIAGNOSIS — R5381 Other malaise: Secondary | ICD-10-CM | POA: Diagnosis not present

## 2012-06-23 DIAGNOSIS — F411 Generalized anxiety disorder: Secondary | ICD-10-CM | POA: Diagnosis not present

## 2012-06-23 DIAGNOSIS — M545 Low back pain: Secondary | ICD-10-CM | POA: Diagnosis not present

## 2012-06-23 DIAGNOSIS — B20 Human immunodeficiency virus [HIV] disease: Secondary | ICD-10-CM | POA: Diagnosis not present

## 2012-06-23 DIAGNOSIS — J45909 Unspecified asthma, uncomplicated: Secondary | ICD-10-CM | POA: Diagnosis not present

## 2012-07-21 DIAGNOSIS — F411 Generalized anxiety disorder: Secondary | ICD-10-CM | POA: Diagnosis not present

## 2012-07-21 DIAGNOSIS — M542 Cervicalgia: Secondary | ICD-10-CM | POA: Diagnosis not present

## 2012-07-21 DIAGNOSIS — R1013 Epigastric pain: Secondary | ICD-10-CM | POA: Diagnosis not present

## 2012-07-21 DIAGNOSIS — M545 Low back pain: Secondary | ICD-10-CM | POA: Diagnosis not present

## 2012-08-14 DIAGNOSIS — E039 Hypothyroidism, unspecified: Secondary | ICD-10-CM | POA: Diagnosis not present

## 2012-08-14 DIAGNOSIS — E782 Mixed hyperlipidemia: Secondary | ICD-10-CM | POA: Diagnosis not present

## 2012-08-14 DIAGNOSIS — E018 Other iodine-deficiency related thyroid disorders and allied conditions: Secondary | ICD-10-CM | POA: Diagnosis not present

## 2012-08-14 DIAGNOSIS — R5383 Other fatigue: Secondary | ICD-10-CM | POA: Diagnosis not present

## 2012-08-14 DIAGNOSIS — I10 Essential (primary) hypertension: Secondary | ICD-10-CM | POA: Diagnosis not present

## 2012-08-20 DIAGNOSIS — B20 Human immunodeficiency virus [HIV] disease: Secondary | ICD-10-CM | POA: Diagnosis not present

## 2012-08-20 DIAGNOSIS — J45909 Unspecified asthma, uncomplicated: Secondary | ICD-10-CM | POA: Diagnosis not present

## 2012-08-20 DIAGNOSIS — M545 Low back pain: Secondary | ICD-10-CM | POA: Diagnosis not present

## 2012-09-17 DIAGNOSIS — M545 Low back pain: Secondary | ICD-10-CM | POA: Diagnosis not present

## 2012-09-17 DIAGNOSIS — B20 Human immunodeficiency virus [HIV] disease: Secondary | ICD-10-CM | POA: Diagnosis not present

## 2012-09-17 DIAGNOSIS — F411 Generalized anxiety disorder: Secondary | ICD-10-CM | POA: Diagnosis not present

## 2012-09-17 DIAGNOSIS — J45909 Unspecified asthma, uncomplicated: Secondary | ICD-10-CM | POA: Diagnosis not present

## 2012-10-01 DIAGNOSIS — G8110 Spastic hemiplegia affecting unspecified side: Secondary | ICD-10-CM | POA: Diagnosis not present

## 2012-10-16 DIAGNOSIS — F411 Generalized anxiety disorder: Secondary | ICD-10-CM | POA: Diagnosis not present

## 2012-10-16 DIAGNOSIS — B20 Human immunodeficiency virus [HIV] disease: Secondary | ICD-10-CM | POA: Diagnosis not present

## 2012-10-16 DIAGNOSIS — M545 Low back pain: Secondary | ICD-10-CM | POA: Diagnosis not present

## 2012-10-20 ENCOUNTER — Other Ambulatory Visit: Payer: Self-pay | Admitting: Licensed Clinical Social Worker

## 2012-10-20 DIAGNOSIS — B20 Human immunodeficiency virus [HIV] disease: Secondary | ICD-10-CM

## 2012-10-20 MED ORDER — EFAVIRENZ-EMTRICITAB-TENOFOVIR 600-200-300 MG PO TABS: 1.0000 | ORAL_TABLET | Freq: Every day | ORAL | Status: DC

## 2012-11-20 ENCOUNTER — Other Ambulatory Visit: Payer: Self-pay | Admitting: *Deleted

## 2012-11-20 DIAGNOSIS — M545 Low back pain: Secondary | ICD-10-CM | POA: Diagnosis not present

## 2012-11-20 DIAGNOSIS — Z79899 Other long term (current) drug therapy: Secondary | ICD-10-CM

## 2012-11-20 DIAGNOSIS — J45909 Unspecified asthma, uncomplicated: Secondary | ICD-10-CM | POA: Diagnosis not present

## 2012-11-20 DIAGNOSIS — B20 Human immunodeficiency virus [HIV] disease: Secondary | ICD-10-CM

## 2012-11-20 DIAGNOSIS — F411 Generalized anxiety disorder: Secondary | ICD-10-CM | POA: Diagnosis not present

## 2012-11-30 ENCOUNTER — Other Ambulatory Visit: Payer: Medicare Other

## 2012-12-14 ENCOUNTER — Telehealth: Payer: Self-pay | Admitting: *Deleted

## 2012-12-14 ENCOUNTER — Ambulatory Visit: Payer: Medicare Other | Admitting: Infectious Diseases

## 2012-12-14 NOTE — Telephone Encounter (Signed)
Called patient to reschedule appt, he no showed today. Phone not in service sent to [employer]. Joel Smith

## 2012-12-18 DIAGNOSIS — B20 Human immunodeficiency virus [HIV] disease: Secondary | ICD-10-CM | POA: Diagnosis not present

## 2012-12-18 DIAGNOSIS — J45909 Unspecified asthma, uncomplicated: Secondary | ICD-10-CM | POA: Diagnosis not present

## 2012-12-18 DIAGNOSIS — M79609 Pain in unspecified limb: Secondary | ICD-10-CM | POA: Diagnosis not present

## 2012-12-18 DIAGNOSIS — M545 Low back pain: Secondary | ICD-10-CM | POA: Diagnosis not present

## 2013-01-18 DIAGNOSIS — J45909 Unspecified asthma, uncomplicated: Secondary | ICD-10-CM | POA: Diagnosis not present

## 2013-01-18 DIAGNOSIS — M545 Low back pain: Secondary | ICD-10-CM | POA: Diagnosis not present

## 2013-01-18 DIAGNOSIS — M79609 Pain in unspecified limb: Secondary | ICD-10-CM | POA: Diagnosis not present

## 2013-01-18 DIAGNOSIS — F411 Generalized anxiety disorder: Secondary | ICD-10-CM | POA: Diagnosis not present

## 2013-01-25 ENCOUNTER — Other Ambulatory Visit: Payer: Medicare Other

## 2013-01-29 ENCOUNTER — Other Ambulatory Visit: Payer: Medicare Other

## 2013-02-04 ENCOUNTER — Other Ambulatory Visit: Payer: Medicare Other

## 2013-02-08 ENCOUNTER — Ambulatory Visit: Payer: Medicare Other | Admitting: Infectious Diseases

## 2013-02-17 ENCOUNTER — Ambulatory Visit: Payer: Medicare Other | Admitting: Infectious Diseases

## 2013-03-24 ENCOUNTER — Other Ambulatory Visit: Payer: Medicare Other

## 2013-03-24 ENCOUNTER — Other Ambulatory Visit: Payer: Self-pay | Admitting: Infectious Disease

## 2013-03-24 ENCOUNTER — Other Ambulatory Visit (HOSPITAL_COMMUNITY)
Admission: RE | Admit: 2013-03-24 | Discharge: 2013-03-24 | Disposition: A | Discharge status: Home / Self Care | Payer: Medicare Other | Source: Ambulatory Visit | Attending: Infectious Disease | Admitting: Infectious Disease

## 2013-03-24 DIAGNOSIS — J029 Acute pharyngitis, unspecified: Secondary | ICD-10-CM | POA: Diagnosis not present

## 2013-03-24 DIAGNOSIS — Z79899 Other long term (current) drug therapy: Secondary | ICD-10-CM | POA: Diagnosis not present

## 2013-03-24 DIAGNOSIS — Z113 Encounter for screening for infections with a predominantly sexual mode of transmission: Secondary | ICD-10-CM | POA: Diagnosis not present

## 2013-03-24 DIAGNOSIS — B20 Human immunodeficiency virus [HIV] disease: Secondary | ICD-10-CM

## 2013-03-24 LAB — RPR

## 2013-03-24 LAB — CBC WITH DIFFERENTIAL/PLATELET
Basophils Relative: 1 % (ref 0–1)
Eosinophils Absolute: 0.1 10*3/uL (ref 0.0–0.7)
HCT: 40 % (ref 39.0–52.0)
Hemoglobin: 13.7 g/dL (ref 13.0–17.0)
Lymphs Abs: 2.4 10*3/uL (ref 0.7–4.0)
MCH: 30.7 pg (ref 26.0–34.0)
MCHC: 34.3 g/dL (ref 30.0–36.0)
Monocytes Absolute: 0.7 10*3/uL (ref 0.1–1.0)
Monocytes Relative: 11 % (ref 3–12)
RBC: 4.46 MIL/uL (ref 4.22–5.81)

## 2013-03-24 LAB — COMPLETE METABOLIC PANEL WITH GFR
Albumin: 4.1 g/dL (ref 3.5–5.2)
Alkaline Phosphatase: 56 U/L (ref 39–117)
BUN: 18 mg/dL (ref 6–23)
CO2: 23 mEq/L (ref 19–32)
GFR, Est African American: 80 mL/min
GFR, Est Non African American: 69 mL/min
Glucose, Bld: 94 mg/dL (ref 70–99)
Potassium: 4.2 mEq/L (ref 3.5–5.3)
Total Protein: 6.8 g/dL (ref 6.0–8.3)

## 2013-03-24 LAB — LIPID PANEL
Cholesterol: 146 mg/dL (ref 0–200)
Total CHOL/HDL Ratio: 2.9 Ratio

## 2013-03-25 LAB — T-HELPER CELL (CD4) - (RCID CLINIC ONLY)
CD4 % Helper T Cell: 28 % — ABNORMAL LOW (ref 33–55)
CD4 T Cell Abs: 650 uL (ref 400–2700)

## 2013-03-26 LAB — CULTURE, GROUP A STREP: Organism ID, Bacteria: NORMAL

## 2013-04-06 ENCOUNTER — Telehealth: Payer: Self-pay | Admitting: Neurology

## 2013-04-07 DIAGNOSIS — F319 Bipolar disorder, unspecified: Secondary | ICD-10-CM | POA: Diagnosis not present

## 2013-04-12 ENCOUNTER — Ambulatory Visit (INDEPENDENT_AMBULATORY_CARE_PROVIDER_SITE_OTHER): Payer: Medicare Other | Admitting: Infectious Diseases

## 2013-04-12 ENCOUNTER — Other Ambulatory Visit: Payer: Self-pay | Admitting: Licensed Clinical Social Worker

## 2013-04-12 ENCOUNTER — Encounter: Payer: Self-pay | Admitting: Infectious Diseases

## 2013-04-12 VITALS — BP 107/72 | HR 76 | Temp 97.9°F | Ht 71.0 in | Wt 206.0 lb

## 2013-04-12 DIAGNOSIS — Z21 Asymptomatic human immunodeficiency virus [HIV] infection status: Secondary | ICD-10-CM

## 2013-04-12 DIAGNOSIS — F319 Bipolar disorder, unspecified: Secondary | ICD-10-CM | POA: Insufficient documentation

## 2013-04-12 DIAGNOSIS — J4521 Mild intermittent asthma with (acute) exacerbation: Secondary | ICD-10-CM

## 2013-04-12 DIAGNOSIS — J4530 Mild persistent asthma, uncomplicated: Secondary | ICD-10-CM

## 2013-04-12 DIAGNOSIS — F99 Mental disorder, not otherwise specified: Secondary | ICD-10-CM

## 2013-04-12 DIAGNOSIS — B20 Human immunodeficiency virus [HIV] disease: Secondary | ICD-10-CM

## 2013-04-12 DIAGNOSIS — J45909 Unspecified asthma, uncomplicated: Secondary | ICD-10-CM | POA: Insufficient documentation

## 2013-04-12 DIAGNOSIS — F489 Nonpsychotic mental disorder, unspecified: Secondary | ICD-10-CM

## 2013-04-12 MED ORDER — ALBUTEROL SULFATE HFA 108 (90 BASE) MCG/ACT IN AERS: 2.0000 | INHALATION_SPRAY | Freq: Four times a day (QID) | RESPIRATORY_TRACT | Status: DC | PRN

## 2013-04-12 MED ORDER — EFAVIRENZ-EMTRICITAB-TENOFOVIR 600-200-300 MG PO TABS: 1.0000 | ORAL_TABLET | Freq: Every day | ORAL | Status: DC

## 2013-04-12 NOTE — Assessment & Plan Note (Addendum)
He is doing well, vax are up to date. He is offered/refuses condoms. He asks for pain clinic referral. Will see him back in 3 months and try to get mental health records.

## 2013-04-12 NOTE — Assessment & Plan Note (Signed)
He has been on pro-air prior but would like refill of albuterol. Will do this, he needs further eval.

## 2013-04-12 NOTE — Assessment & Plan Note (Addendum)
He appears to be on significant mental health rx's. Needs to f/u with his pcp.

## 2013-04-12 NOTE — Progress Notes (Signed)
  Subjective:    Patient ID: Joel Smith, male    DOB: June 28, 1981, 32 y.o.   MRN: [id number]  HPI 32 yo M who tested HIV+ 01-2002. He had previously been treated with KLT/CBV and was changed to [place].  No probs with meds, needs refills.  States his voice is raspy but that it is not changed.  Denies hx of anal warts and denies that he has warts in his oropharynx.  Wants refill on his asthma medicine. Ear has been hurting, "hope a roach didn't crawl up in there". Wants referral to pain clinic.   HIV 1 RNA Quant (copies/mL)  Date Value  03/24/2013 <20   08/21/2011 <20   01/16/2011 <20 copies/mL      CD4 T Cell Abs (cmm)  Date Value  03/24/2013 650   08/21/2011 960   01/16/2011 780     Review of Systems  Constitutional: Negative for appetite change and unexpected weight change.  Respiratory: Negative for cough and shortness of Smith.   Gastrointestinal: Negative for diarrhea and constipation.  Genitourinary: Negative for discharge, difficulty urinating and genital sores.       Objective:   Physical Exam  Constitutional: He appears well-developed and well-nourished.  HENT:  Right Ear: External ear and ear canal normal.  Left Ear: External ear and ear canal normal. Tympanic membrane is injected and scarred.  Mouth/Throat: No oropharyngeal exudate.  Eyes: EOM are normal. Pupils are equal, round, and reactive to light.  Neck: Neck supple.  Cardiovascular: Normal rate, regular rhythm and normal heart sounds.   Pulmonary/Chest: Effort normal and Smith sounds normal.  Abdominal: Soft. Bowel sounds are normal. There is no tenderness.  Musculoskeletal: He exhibits no edema.  Lymphadenopathy:    He has no cervical adenopathy.          Assessment & Plan:

## 2013-04-14 ENCOUNTER — Ambulatory Visit (INDEPENDENT_AMBULATORY_CARE_PROVIDER_SITE_OTHER): Payer: Medicare Other | Admitting: Neurology

## 2013-04-14 ENCOUNTER — Encounter: Payer: Self-pay | Admitting: Neurology

## 2013-04-14 VITALS — BP 120/73 | HR 100 | Ht 71.0 in | Wt 205.0 lb

## 2013-04-14 DIAGNOSIS — F319 Bipolar disorder, unspecified: Secondary | ICD-10-CM

## 2013-04-14 DIAGNOSIS — G8114 Spastic hemiplegia affecting left nondominant side: Secondary | ICD-10-CM | POA: Insufficient documentation

## 2013-04-14 DIAGNOSIS — G8110 Spastic hemiplegia affecting unspecified side: Secondary | ICD-10-CM | POA: Diagnosis not present

## 2013-04-14 MED ORDER — ONABOTULINUMTOXINA 100 UNITS IJ SOLR
400.0000 [IU] | Freq: Once | INTRAMUSCULAR | Status: AC
  Administered 2013-04-14: 400 [IU] via INTRAMUSCULAR
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
## 2013-04-19 ENCOUNTER — Telehealth: Payer: Self-pay | Admitting: Licensed Clinical Social Worker

## 2013-04-19 ENCOUNTER — Other Ambulatory Visit: Payer: Self-pay | Admitting: Licensed Clinical Social Worker

## 2013-04-19 DIAGNOSIS — B20 Human immunodeficiency virus [HIV] disease: Secondary | ICD-10-CM

## 2013-04-19 MED ORDER — VALACYCLOVIR HCL 1 G PO TABS: 1000.0000 mg | ORAL_TABLET | Freq: Every day | ORAL | Status: DC

## 2013-04-19 NOTE — Telephone Encounter (Signed)
Patient called inquiring about his referral to the pain clinic, I told him I would forward to the nurse working with Dr. Ninetta Lights on 6/2 and we will return his call.

## 2013-04-22 ENCOUNTER — Telehealth: Payer: Self-pay | Admitting: *Deleted

## 2013-04-22 NOTE — Telephone Encounter (Signed)
Patient called about his Pain Clinic referral and I advised him had sent the information to [facility] and have not heard but that I will call them to see what the reason is we have not heard anything. And call him back he gave [phone], [phone], and [phone] as contacts. He is really anxious about this and wants to be seen soon.

## 2013-04-27 NOTE — Telephone Encounter (Signed)
Called the pain clinic to see if they got the referral and was advised yes and they are still reviewing it and will let us know a decision soon. Will call the patient and inform him of this.

## 2013-04-28 ENCOUNTER — Emergency Department (HOSPITAL_COMMUNITY)
Admission: EM | Admit: 2013-04-28 | Discharge: 2013-04-28 | Disposition: A | Discharge status: Home / Self Care | Payer: Medicare Other | Attending: Emergency Medicine | Admitting: Emergency Medicine

## 2013-04-28 ENCOUNTER — Encounter (HOSPITAL_COMMUNITY): Payer: Self-pay | Admitting: Family Medicine

## 2013-04-28 ENCOUNTER — Encounter: Payer: Self-pay | Admitting: *Deleted

## 2013-04-28 DIAGNOSIS — Z87828 Personal history of other (healed) physical injury and trauma: Secondary | ICD-10-CM | POA: Diagnosis not present

## 2013-04-28 DIAGNOSIS — M545 Low back pain, unspecified: Secondary | ICD-10-CM | POA: Insufficient documentation

## 2013-04-28 DIAGNOSIS — G8190 Hemiplegia, unspecified affecting unspecified side: Secondary | ICD-10-CM | POA: Diagnosis not present

## 2013-04-28 DIAGNOSIS — R209 Unspecified disturbances of skin sensation: Secondary | ICD-10-CM | POA: Insufficient documentation

## 2013-04-28 DIAGNOSIS — G8929 Other chronic pain: Secondary | ICD-10-CM | POA: Diagnosis not present

## 2013-04-28 DIAGNOSIS — Z79899 Other long term (current) drug therapy: Secondary | ICD-10-CM | POA: Insufficient documentation

## 2013-04-28 DIAGNOSIS — Z21 Asymptomatic human immunodeficiency virus [HIV] infection status: Secondary | ICD-10-CM | POA: Insufficient documentation

## 2013-04-28 DIAGNOSIS — F319 Bipolar disorder, unspecified: Secondary | ICD-10-CM | POA: Diagnosis not present

## 2013-04-28 DIAGNOSIS — F17200 Nicotine dependence, unspecified, uncomplicated: Secondary | ICD-10-CM | POA: Insufficient documentation

## 2013-04-28 MED ORDER — OXYCODONE-ACETAMINOPHEN 5-325 MG PO TABS: 1.0000 | ORAL_TABLET | Freq: Four times a day (QID) | ORAL | Status: DC | PRN

## 2013-04-28 NOTE — ED Notes (Signed)
The patient complained about his wait so far and asked when the provider will be in his room.  I advised that she would be in to see him asap.

## 2013-04-28 NOTE — ED Notes (Signed)
The patient rang the call light and complained about the wait.  He advised that he was very cold, so I brought him a blanket.

## 2013-04-28 NOTE — ED Notes (Signed)
The patient complained about the wait and demanded a [place] sandwich.  I advised that we needed to have the PA see him first.  He again complained about the wait.  PA advised of the patient's frustrations.

## 2013-04-28 NOTE — ED Notes (Signed)
Per pt sent here by doctor for back pain. sts suppose to be getting into pain clinic. sts usually takes percocet and xanax.

## 2013-04-28 NOTE — ED Notes (Signed)
The patient refused to let me take his last set of vitals.

## 2013-04-28 NOTE — ED Notes (Signed)
MD at bedside. 

## 2013-04-28 NOTE — ED Notes (Signed)
The patient is AOx4 and comfortable with the discharge instructions. 

## 2013-04-28 NOTE — Progress Notes (Signed)
Received note back from [facility] that they would not schedule the patient. Will now send his information to [facility] and wait for their response.

## 2013-04-28 NOTE — ED Notes (Signed)
The patient rang the call light to complain about the wait, and he complained that I was helping other patients and not him.  I advised that I was helping them because I had tasks assigned to me for them and that I would do the same for him once the PA saw him and gave me things to do for him.  He then complained about not eating yet, so I reiterated the need to wait.  Finally, he demanded that I keep the door open so he "could hear what I'm talking about at the nurses' station."

## 2013-04-28 NOTE — ED Notes (Signed)
The patient complained that the wait was too long and that he was still cold.  He also advised that it was still too cold and that he turned the temperature up on the thermostat to 88 degrees.  I advised that only the staff should adjust the temperature and offered to get another blanket.  He declined.  I said I would leave the door open since it was warmer in the hallway and he nodded in agreement.
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
## 2013-04-29 NOTE — ED Provider Notes (Signed)
Medical screening examination/treatment/procedure(s) were performed by non-physician practitioner and as supervising physician I was immediately available for consultation/collaboration.  Bali Lyn M Ousman Dise, MD 04/29/13 1952 

## 2013-05-04 ENCOUNTER — Telehealth: Payer: Self-pay | Admitting: Licensed Clinical Social Worker

## 2013-05-04 NOTE — Telephone Encounter (Signed)
Patient called stating that he was still waiting for an appointment for a referral. I gave him the number to call [facility] and Pain Management, and he informed us that he had been denied. He stated that he wanted to go to [facility]. I will send his information to [facility].

## 2013-05-11 DIAGNOSIS — F319 Bipolar disorder, unspecified: Secondary | ICD-10-CM | POA: Diagnosis not present

## 2013-05-12 NOTE — Telephone Encounter (Signed)
Info sent to the [facility] on 04/29/13 but have to wait to hear from them. Will call the patient once I hear something.

## 2013-05-20 DIAGNOSIS — A5400 Gonococcal infection of lower genitourinary tract, unspecified: Secondary | ICD-10-CM | POA: Diagnosis not present

## 2013-05-20 DIAGNOSIS — A749 Chlamydial infection, unspecified: Secondary | ICD-10-CM | POA: Diagnosis not present

## 2013-05-20 DIAGNOSIS — Z113 Encounter for screening for infections with a predominantly sexual mode of transmission: Secondary | ICD-10-CM | POA: Diagnosis not present

## 2013-05-24 ENCOUNTER — Telehealth: Payer: Self-pay | Admitting: *Deleted

## 2013-05-24 NOTE — Telephone Encounter (Signed)
Called the patient at all numbers listed in his chart to advise him that the Pain center is trying to call him to schedule an appt with him. If he calls back will advise him to call the [facility] at [phone] to make an appt asap.

## 2013-06-23 DIAGNOSIS — F319 Bipolar disorder, unspecified: Secondary | ICD-10-CM | POA: Diagnosis not present

## 2013-06-30 ENCOUNTER — Other Ambulatory Visit: Payer: Medicare Other

## 2013-07-08 ENCOUNTER — Telehealth: Payer: Self-pay | Admitting: *Deleted

## 2013-07-08 NOTE — Telephone Encounter (Signed)
Called patient in response to a message he left about his Pain Clinic referral. He left [phone] and [phone] for me to call but he was not available so I left a message for him to call me back asap.

## 2013-07-14 ENCOUNTER — Ambulatory Visit: Payer: Medicare Other | Admitting: Infectious Diseases

## 2013-07-15 ENCOUNTER — Emergency Department (HOSPITAL_COMMUNITY)
Admission: EM | Admit: 2013-07-15 | Discharge: 2013-07-15 | Disposition: A | Discharge status: Home / Self Care | Payer: Medicare Other | Attending: Emergency Medicine | Admitting: Emergency Medicine

## 2013-07-15 ENCOUNTER — Encounter (HOSPITAL_COMMUNITY): Payer: Self-pay | Admitting: *Deleted

## 2013-07-15 ENCOUNTER — Encounter: Payer: Self-pay | Admitting: *Deleted

## 2013-07-15 ENCOUNTER — Other Ambulatory Visit: Payer: Medicare Other

## 2013-07-15 DIAGNOSIS — G8929 Other chronic pain: Secondary | ICD-10-CM | POA: Diagnosis not present

## 2013-07-15 DIAGNOSIS — Z8659 Personal history of other mental and behavioral disorders: Secondary | ICD-10-CM | POA: Diagnosis not present

## 2013-07-15 DIAGNOSIS — Z79899 Other long term (current) drug therapy: Secondary | ICD-10-CM | POA: Insufficient documentation

## 2013-07-15 DIAGNOSIS — M545 Low back pain, unspecified: Secondary | ICD-10-CM | POA: Diagnosis not present

## 2013-07-15 DIAGNOSIS — Z8669 Personal history of other diseases of the nervous system and sense organs: Secondary | ICD-10-CM | POA: Insufficient documentation

## 2013-07-15 DIAGNOSIS — Z21 Asymptomatic human immunodeficiency virus [HIV] infection status: Secondary | ICD-10-CM | POA: Insufficient documentation

## 2013-07-15 DIAGNOSIS — B20 Human immunodeficiency virus [HIV] disease: Secondary | ICD-10-CM | POA: Diagnosis not present

## 2013-07-15 DIAGNOSIS — F17200 Nicotine dependence, unspecified, uncomplicated: Secondary | ICD-10-CM | POA: Diagnosis not present

## 2013-07-15 LAB — COMPLETE METABOLIC PANEL WITH GFR
ALT: 18 U/L (ref 0–53)
AST: 21 U/L (ref 0–37)
Alkaline Phosphatase: 69 U/L (ref 39–117)
CO2: 25 mEq/L (ref 19–32)
Creat: 1.11 mg/dL (ref 0.50–1.35)
GFR, Est African American: >89 mL/min
Sodium: 141 mEq/L (ref 135–145)
Total Bilirubin: 0.4 mg/dL (ref 0.3–1.2)
Total Protein: 7.3 g/dL (ref 6.0–8.3)

## 2013-07-15 LAB — CBC
MCH: 31.4 pg (ref 26.0–34.0)
MCHC: 34.2 g/dL (ref 30.0–36.0)
MCV: 91.8 fL (ref 78.0–100.0)
Platelets: 262 10*3/uL (ref 150–400)
RBC: 4.62 MIL/uL (ref 4.22–5.81)
RDW: 14 % (ref 11.5–15.5)

## 2013-07-15 MED ORDER — OXYCODONE-ACETAMINOPHEN 5-325 MG PO TABS
1.0000 | ORAL_TABLET | Freq: Once | ORAL | Status: AC
  Administered 2013-07-15: 1 via ORAL
  Filled 2013-07-15: qty 1

## 2013-07-15 MED ORDER — OXYCODONE-ACETAMINOPHEN 5-325 MG PO TABS: 1.0000 | ORAL_TABLET | Freq: Four times a day (QID) | ORAL | Status: DC | PRN

## 2013-07-15 NOTE — Progress Notes (Signed)
Faxed notes to [facility] for the patient and called the patient to let him know that the information was sent and as soon as I hear from them will call and inform him.

## 2013-07-15 NOTE — Progress Notes (Deleted)
Patient ID: Joel Smith, male   DOB: January 07, 1981, 32 y.o.   MRN: [id number]

## 2013-07-15 NOTE — ED Provider Notes (Signed)
Medical screening examination/treatment/procedure(s) were performed by non-physician practitioner and as supervising physician I was immediately available for consultation/collaboration.   William Davidmichael Zarazua, MD 07/15/13 1452 

## 2013-07-15 NOTE — ED Notes (Signed)
Reports chronic pain to back, left hip and shoulder, is out of pain meds but is working on getting a new pcp. No acute distress noted at triage.
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
## 2013-07-16 LAB — T-HELPER CELL (CD4) - (RCID CLINIC ONLY): CD4 % Helper T Cell: 32 % — ABNORMAL LOW (ref 33–55)

## 2013-07-19 ENCOUNTER — Telehealth: Payer: Self-pay | Admitting: *Deleted

## 2013-07-19 LAB — HIV-1 RNA QUANT-NO REFLEX-BLD: HIV 1 RNA Quant: <20 copies/mL (ref <20)

## 2013-07-19 NOTE — Telephone Encounter (Signed)
Received documentation that the patient is scheduled to see them 08/17/13 at 830 am.  And that they have contacted the patient but I called him also as he has been very anxious about this appt. He is aware and knows to call if something comes up and please do not No Show this appt.

## 2013-08-04 ENCOUNTER — Other Ambulatory Visit: Payer: Medicare Other

## 2013-08-11 DIAGNOSIS — G608 Other hereditary and idiopathic neuropathies: Secondary | ICD-10-CM | POA: Diagnosis not present

## 2013-08-11 DIAGNOSIS — S335XXA Sprain of ligaments of lumbar spine, initial encounter: Secondary | ICD-10-CM | POA: Diagnosis not present

## 2013-08-11 DIAGNOSIS — M545 Low back pain: Secondary | ICD-10-CM | POA: Diagnosis not present

## 2013-08-11 DIAGNOSIS — R269 Unspecified abnormalities of gait and mobility: Secondary | ICD-10-CM | POA: Diagnosis not present

## 2013-08-11 DIAGNOSIS — H8190 Unspecified disorder of vestibular function, unspecified ear: Secondary | ICD-10-CM | POA: Diagnosis not present

## 2013-08-11 DIAGNOSIS — G541 Lumbosacral plexus disorders: Secondary | ICD-10-CM | POA: Diagnosis not present

## 2013-08-11 DIAGNOSIS — M533 Sacrococcygeal disorders, not elsewhere classified: Secondary | ICD-10-CM | POA: Diagnosis not present

## 2013-08-11 DIAGNOSIS — H81399 Other peripheral vertigo, unspecified ear: Secondary | ICD-10-CM | POA: Diagnosis not present

## 2013-08-18 ENCOUNTER — Ambulatory Visit: Payer: Medicare Other | Admitting: Infectious Diseases

## 2013-08-23 ENCOUNTER — Ambulatory Visit (INDEPENDENT_AMBULATORY_CARE_PROVIDER_SITE_OTHER): Payer: Medicare Other | Admitting: Infectious Diseases

## 2013-08-23 ENCOUNTER — Encounter: Payer: Self-pay | Admitting: Infectious Diseases

## 2013-08-23 VITALS — BP 114/80 | HR 82 | Temp 97.9°F | Ht 71.0 in | Wt 232.0 lb

## 2013-08-23 DIAGNOSIS — F17200 Nicotine dependence, unspecified, uncomplicated: Secondary | ICD-10-CM | POA: Diagnosis not present

## 2013-08-23 DIAGNOSIS — Z79899 Other long term (current) drug therapy: Secondary | ICD-10-CM

## 2013-08-23 DIAGNOSIS — B20 Human immunodeficiency virus [HIV] disease: Secondary | ICD-10-CM

## 2013-08-23 DIAGNOSIS — Z23 Encounter for immunization: Secondary | ICD-10-CM

## 2013-08-23 DIAGNOSIS — Z113 Encounter for screening for infections with a predominantly sexual mode of transmission: Secondary | ICD-10-CM | POA: Diagnosis not present

## 2013-08-23 MED ORDER — VARENICLINE TARTRATE 0.5 MG X 11 & 1 MG X 42 PO MISC: ORAL | Status: DC

## 2013-08-23 NOTE — Assessment & Plan Note (Signed)
Will write him rx for chantix.

## 2013-08-23 NOTE — Progress Notes (Signed)
  Subjective:    Patient ID: Keene Breath, male    DOB: 08-Mar-1981, 32 y.o.   MRN: [id number]  HPI 32 yo M who tested HIV+ 01-2002. He had previously been treated with KLT/CBV and was changed to [place].  Has been feeling well. Has gained 20+#. Has been eating too much, not exercising.   HIV 1 RNA Quant (copies/mL)  Date Value  07/15/2013 <20   03/24/2013 <20   08/21/2011 <20      CD4 T Cell Abs (/uL)  Date Value  07/15/2013 770   03/24/2013 650   08/21/2011 960     Review of Systems  Constitutional: Positive for unexpected weight change. Negative for appetite change.  Gastrointestinal: Negative for diarrhea and constipation.  Genitourinary: Negative for difficulty urinating.       Objective:   Physical Exam  Constitutional: He appears well-developed and well-nourished.  HENT:  Mouth/Throat: No oropharyngeal exudate.  Eyes: EOM are normal. Pupils are equal, round, and reactive to light.  Neck: Neck supple.  Cardiovascular: Normal rate, regular rhythm and normal heart sounds.   Pulmonary/Chest: Effort normal and breath sounds normal.  Abdominal: Soft. Bowel sounds are normal. He exhibits no distension. There is no tenderness.  Lymphadenopathy:    He has no cervical adenopathy.          Assessment & Plan:

## 2013-08-23 NOTE — Assessment & Plan Note (Signed)
He is doing well. Given condoms. Encouraged him to watch diet, exercise. Will see him back in 6 months with labs. Gets flu shot today.

## 2013-09-01 DIAGNOSIS — F319 Bipolar disorder, unspecified: Secondary | ICD-10-CM | POA: Diagnosis not present

## 2013-09-03 DIAGNOSIS — F319 Bipolar disorder, unspecified: Secondary | ICD-10-CM | POA: Diagnosis not present

## 2013-09-10 DIAGNOSIS — G541 Lumbosacral plexus disorders: Secondary | ICD-10-CM | POA: Diagnosis not present

## 2013-09-10 DIAGNOSIS — H8190 Unspecified disorder of vestibular function, unspecified ear: Secondary | ICD-10-CM | POA: Diagnosis not present

## 2013-09-10 DIAGNOSIS — G608 Other hereditary and idiopathic neuropathies: Secondary | ICD-10-CM | POA: Diagnosis not present

## 2013-09-10 DIAGNOSIS — H81399 Other peripheral vertigo, unspecified ear: Secondary | ICD-10-CM | POA: Diagnosis not present

## 2013-10-04 DIAGNOSIS — M546 Pain in thoracic spine: Secondary | ICD-10-CM | POA: Diagnosis not present

## 2013-10-04 DIAGNOSIS — M5430 Sciatica, unspecified side: Secondary | ICD-10-CM | POA: Diagnosis not present

## 2013-10-04 DIAGNOSIS — Z79899 Other long term (current) drug therapy: Secondary | ICD-10-CM | POA: Diagnosis not present

## 2013-10-04 DIAGNOSIS — M545 Low back pain: Secondary | ICD-10-CM | POA: Diagnosis not present

## 2013-10-04 DIAGNOSIS — M542 Cervicalgia: Secondary | ICD-10-CM | POA: Diagnosis not present

## 2013-10-06 DIAGNOSIS — M542 Cervicalgia: Secondary | ICD-10-CM | POA: Diagnosis not present

## 2013-10-06 DIAGNOSIS — G540 Brachial plexus disorders: Secondary | ICD-10-CM | POA: Diagnosis not present

## 2013-10-06 DIAGNOSIS — M5412 Radiculopathy, cervical region: Secondary | ICD-10-CM | POA: Diagnosis not present

## 2013-11-08 DIAGNOSIS — Z79899 Other long term (current) drug therapy: Secondary | ICD-10-CM | POA: Diagnosis not present

## 2013-11-08 DIAGNOSIS — R209 Unspecified disturbances of skin sensation: Secondary | ICD-10-CM | POA: Diagnosis not present

## 2013-11-08 DIAGNOSIS — M545 Low back pain: Secondary | ICD-10-CM | POA: Diagnosis not present

## 2013-11-08 DIAGNOSIS — M5412 Radiculopathy, cervical region: Secondary | ICD-10-CM | POA: Diagnosis not present

## 2013-11-08 DIAGNOSIS — M542 Cervicalgia: Secondary | ICD-10-CM | POA: Diagnosis not present

## 2013-11-08 DIAGNOSIS — G540 Brachial plexus disorders: Secondary | ICD-10-CM | POA: Diagnosis not present

## 2014-01-26 ENCOUNTER — Other Ambulatory Visit: Payer: Self-pay | Admitting: Infectious Diseases

## 2014-02-18 ENCOUNTER — Emergency Department (HOSPITAL_COMMUNITY)
Admission: EM | Admit: 2014-02-18 | Discharge: 2014-02-19 | Disposition: A | Discharge status: Other Acute Inpatient Hospital | Payer: Medicare Other | Attending: Emergency Medicine | Admitting: Emergency Medicine

## 2014-02-18 DIAGNOSIS — F1910 Other psychoactive substance abuse, uncomplicated: Secondary | ICD-10-CM | POA: Diagnosis present

## 2014-02-18 DIAGNOSIS — Z8659 Personal history of other mental and behavioral disorders: Secondary | ICD-10-CM | POA: Insufficient documentation

## 2014-02-18 DIAGNOSIS — F1210 Cannabis abuse, uncomplicated: Secondary | ICD-10-CM | POA: Insufficient documentation

## 2014-02-18 DIAGNOSIS — F319 Bipolar disorder, unspecified: Secondary | ICD-10-CM | POA: Diagnosis present

## 2014-02-18 DIAGNOSIS — Z21 Asymptomatic human immunodeficiency virus [HIV] infection status: Secondary | ICD-10-CM | POA: Insufficient documentation

## 2014-02-18 DIAGNOSIS — F1410 Cocaine abuse, uncomplicated: Secondary | ICD-10-CM | POA: Insufficient documentation

## 2014-02-18 DIAGNOSIS — F17200 Nicotine dependence, unspecified, uncomplicated: Secondary | ICD-10-CM | POA: Insufficient documentation

## 2014-02-18 DIAGNOSIS — G8921 Chronic pain due to trauma: Secondary | ICD-10-CM | POA: Insufficient documentation

## 2014-02-18 DIAGNOSIS — F1020 Alcohol dependence, uncomplicated: Secondary | ICD-10-CM | POA: Diagnosis present

## 2014-02-18 DIAGNOSIS — Z8669 Personal history of other diseases of the nervous system and sense organs: Secondary | ICD-10-CM | POA: Insufficient documentation

## 2014-02-18 NOTE — ED Notes (Signed)
Pt has [employer] cup that he states "has alcohol in it". Pt informed that hospital staff would need to confiscate the cup. Pt quickly drank what was left in his cup.

## 2014-02-18 NOTE — ED Notes (Signed)
Pt went outside for unknown amount of time. Pt now back in wating rm. Still requesting to be seen.

## 2014-02-18 NOTE — ED Notes (Signed)
Pt not in WR when called

## 2014-02-19 ENCOUNTER — Encounter (HOSPITAL_COMMUNITY): Payer: Self-pay | Admitting: Emergency Medicine

## 2014-02-19 ENCOUNTER — Encounter (HOSPITAL_COMMUNITY): Payer: Self-pay | Admitting: Behavioral Health

## 2014-02-19 ENCOUNTER — Inpatient Hospital Stay (HOSPITAL_COMMUNITY)
Admission: AD | Admit: 2014-02-19 | Discharge: 2014-02-25 | DRG: 897 | Disposition: A | Discharge status: Home / Self Care | Payer: Medicare Other | Source: Intra-hospital | Attending: Psychiatry | Admitting: Psychiatry

## 2014-02-19 DIAGNOSIS — F319 Bipolar disorder, unspecified: Secondary | ICD-10-CM | POA: Diagnosis present

## 2014-02-19 DIAGNOSIS — Z598 Other problems related to housing and economic circumstances: Secondary | ICD-10-CM

## 2014-02-19 DIAGNOSIS — Z803 Family history of malignant neoplasm of breast: Secondary | ICD-10-CM

## 2014-02-19 DIAGNOSIS — G8929 Other chronic pain: Secondary | ICD-10-CM | POA: Diagnosis present

## 2014-02-19 DIAGNOSIS — M549 Dorsalgia, unspecified: Secondary | ICD-10-CM | POA: Diagnosis present

## 2014-02-19 DIAGNOSIS — F411 Generalized anxiety disorder: Secondary | ICD-10-CM | POA: Diagnosis present

## 2014-02-19 DIAGNOSIS — F1410 Cocaine abuse, uncomplicated: Secondary | ICD-10-CM | POA: Diagnosis present

## 2014-02-19 DIAGNOSIS — Z21 Asymptomatic human immunodeficiency virus [HIV] infection status: Secondary | ICD-10-CM | POA: Diagnosis present

## 2014-02-19 DIAGNOSIS — Z8249 Family history of ischemic heart disease and other diseases of the circulatory system: Secondary | ICD-10-CM

## 2014-02-19 DIAGNOSIS — F1020 Alcohol dependence, uncomplicated: Principal | ICD-10-CM | POA: Diagnosis present

## 2014-02-19 DIAGNOSIS — B20 Human immunodeficiency virus [HIV] disease: Secondary | ICD-10-CM

## 2014-02-19 DIAGNOSIS — F259 Schizoaffective disorder, unspecified: Secondary | ICD-10-CM | POA: Diagnosis present

## 2014-02-19 DIAGNOSIS — Z5987 Material hardship due to limited financial resources, not elsewhere classified: Secondary | ICD-10-CM

## 2014-02-19 DIAGNOSIS — F1994 Other psychoactive substance use, unspecified with psychoactive substance-induced mood disorder: Secondary | ICD-10-CM | POA: Diagnosis present

## 2014-02-19 DIAGNOSIS — F1010 Alcohol abuse, uncomplicated: Secondary | ICD-10-CM

## 2014-02-19 DIAGNOSIS — F17200 Nicotine dependence, unspecified, uncomplicated: Secondary | ICD-10-CM | POA: Diagnosis present

## 2014-02-19 DIAGNOSIS — F1910 Other psychoactive substance abuse, uncomplicated: Secondary | ICD-10-CM

## 2014-02-19 DIAGNOSIS — Z5989 Other problems related to housing and economic circumstances: Secondary | ICD-10-CM | POA: Diagnosis not present

## 2014-02-19 DIAGNOSIS — F1210 Cannabis abuse, uncomplicated: Secondary | ICD-10-CM | POA: Diagnosis present

## 2014-02-19 DIAGNOSIS — F329 Major depressive disorder, single episode, unspecified: Secondary | ICD-10-CM | POA: Diagnosis present

## 2014-02-19 DIAGNOSIS — F3130 Bipolar disorder, current episode depressed, mild or moderate severity, unspecified: Secondary | ICD-10-CM

## 2014-02-19 HISTORY — DX: Schizophrenia, unspecified: F20.9

## 2014-02-19 LAB — RAPID URINE DRUG SCREEN, HOSP PERFORMED
Amphetamines: NOT DETECTED
BARBITURATES: NOT DETECTED
Benzodiazepines: NOT DETECTED
COCAINE: POSITIVE — AB
Opiates: NOT DETECTED
TETRAHYDROCANNABINOL: POSITIVE — AB

## 2014-02-19 LAB — CBC WITH DIFFERENTIAL/PLATELET
Basophils Absolute: 0 10*3/uL (ref 0.0–0.1)
Basophils Relative: 0 % (ref 0–1)
Eosinophils Absolute: 0.2 10*3/uL (ref 0.0–0.7)
Eosinophils Relative: 2 % (ref 0–5)
HCT: 44.8 % (ref 39.0–52.0)
Hemoglobin: 14.9 g/dL (ref 13.0–17.0)
LYMPHS PCT: 43 % (ref 12–46)
Lymphs Abs: 3.8 10*3/uL (ref 0.7–4.0)
MCH: 31.7 pg (ref 26.0–34.0)
MCHC: 33.3 g/dL (ref 30.0–36.0)
MCV: 95.3 fL (ref 78.0–100.0)
MONO ABS: 0.8 10*3/uL (ref 0.1–1.0)
Monocytes Relative: 9 % (ref 3–12)
NEUTROS ABS: 4.1 10*3/uL (ref 1.7–7.7)
Neutrophils Relative %: 46 % (ref 43–77)
PLATELETS: 299 10*3/uL (ref 150–400)
RBC: 4.7 MIL/uL (ref 4.22–5.81)
RDW: 13.4 % (ref 11.5–15.5)
WBC: 9 10*3/uL (ref 4.0–10.5)

## 2014-02-19 LAB — COMPREHENSIVE METABOLIC PANEL
ALT: 28 U/L (ref 0–53)
AST: 28 U/L (ref 0–37)
Albumin: 4 g/dL (ref 3.5–5.2)
Alkaline Phosphatase: 78 U/L (ref 39–117)
BUN: 20 mg/dL (ref 6–23)
CHLORIDE: 102 meq/L (ref 96–112)
CO2: 18 meq/L — AB (ref 19–32)
Calcium: 9.2 mg/dL (ref 8.4–10.5)
Creatinine, Ser: 1.42 mg/dL — ABNORMAL HIGH (ref 0.50–1.35)
GFR, EST AFRICAN AMERICAN: 74 mL/min — AB (ref >90)
GFR, EST NON AFRICAN AMERICAN: 64 mL/min — AB (ref >90)
GLUCOSE: 101 mg/dL — AB (ref 70–99)
Potassium: 3.9 mEq/L (ref 3.7–5.3)
SODIUM: 140 meq/L (ref 137–147)
Total Protein: 8 g/dL (ref 6.0–8.3)

## 2014-02-19 LAB — ETHANOL: Alcohol, Ethyl (B): 167 mg/dL — ABNORMAL HIGH (ref 0–11)

## 2014-02-19 MED ORDER — CHLORDIAZEPOXIDE HCL 25 MG PO CAPS
25.0000 mg | ORAL_CAPSULE | ORAL | Status: AC
  Administered 2014-02-21 – 2014-02-22 (×2): 25 mg via ORAL
  Filled 2014-02-19 (×2): qty 1

## 2014-02-19 MED ORDER — VALACYCLOVIR HCL 500 MG PO TABS
1000.0000 mg | ORAL_TABLET | Freq: Every day | ORAL | Status: DC
  Administered 2014-02-19: 1000 mg via ORAL
  Filled 2014-02-19: qty 2

## 2014-02-19 MED ORDER — ADULT MULTIVITAMIN W/MINERALS CH
1.0000 | ORAL_TABLET | Freq: Every day | ORAL | Status: DC
  Administered 2014-02-19 – 2014-02-25 (×7): 1 via ORAL
  Filled 2014-02-19 (×9): qty 1

## 2014-02-19 MED ORDER — LORAZEPAM 1 MG PO TABS: 0.0000 mg | ORAL_TABLET | Freq: Four times a day (QID) | ORAL | Status: DC

## 2014-02-19 MED ORDER — ACETAMINOPHEN 325 MG PO TABS
650.0000 mg | ORAL_TABLET | Freq: Four times a day (QID) | ORAL | Status: DC | PRN
  Administered 2014-02-19 – 2014-02-25 (×8): 650 mg via ORAL
  Filled 2014-02-19 (×8): qty 2

## 2014-02-19 MED ORDER — LOPERAMIDE HCL 2 MG PO CAPS: 2.0000 mg | ORAL_CAPSULE | ORAL | Status: AC | PRN

## 2014-02-19 MED ORDER — EFAVIRENZ-EMTRICITAB-TENOFOVIR 600-200-300 MG PO TABS
1.0000 | ORAL_TABLET | Freq: Every day | ORAL | Status: DC
  Administered 2014-02-19 – 2014-02-24 (×6): 1 via ORAL
  Filled 2014-02-19 (×9): qty 1

## 2014-02-19 MED ORDER — MAGNESIUM HYDROXIDE 400 MG/5ML PO SUSP: 30.0000 mL | Freq: Every day | ORAL | Status: DC | PRN

## 2014-02-19 MED ORDER — VALACYCLOVIR HCL 500 MG PO TABS
1000.0000 mg | ORAL_TABLET | Freq: Every day | ORAL | Status: DC
  Administered 2014-02-20 – 2014-02-25 (×6): 1000 mg via ORAL
  Filled 2014-02-19 (×8): qty 2

## 2014-02-19 MED ORDER — CHLORDIAZEPOXIDE HCL 25 MG PO CAPS
25.0000 mg | ORAL_CAPSULE | Freq: Every day | ORAL | Status: AC
  Administered 2014-02-23: 25 mg via ORAL
  Filled 2014-02-19: qty 1

## 2014-02-19 MED ORDER — LORAZEPAM 1 MG PO TABS: 0.0000 mg | ORAL_TABLET | Freq: Two times a day (BID) | ORAL | Status: DC

## 2014-02-19 MED ORDER — HYDROXYZINE HCL 50 MG PO TABS
50.0000 mg | ORAL_TABLET | Freq: Every evening | ORAL | Status: DC | PRN
  Administered 2014-02-20 – 2014-02-25 (×8): 50 mg via ORAL
  Filled 2014-02-19 (×8): qty 1

## 2014-02-19 MED ORDER — HYDROXYZINE HCL 25 MG PO TABS: 25.0000 mg | ORAL_TABLET | Freq: Four times a day (QID) | ORAL | Status: AC | PRN

## 2014-02-19 MED ORDER — ONDANSETRON 4 MG PO TBDP
4.0000 mg | ORAL_TABLET | Freq: Four times a day (QID) | ORAL | Status: AC | PRN
  Administered 2014-02-21: 4 mg via ORAL
  Filled 2014-02-19: qty 1

## 2014-02-19 MED ORDER — ALUM & MAG HYDROXIDE-SIMETH 200-200-20 MG/5ML PO SUSP
30.0000 mL | ORAL | Status: DC | PRN
  Administered 2014-02-22: 30 mL via ORAL

## 2014-02-19 MED ORDER — CHLORDIAZEPOXIDE HCL 25 MG PO CAPS
25.0000 mg | ORAL_CAPSULE | Freq: Once | ORAL | Status: AC
  Administered 2014-02-19: 25 mg via ORAL
  Filled 2014-02-19: qty 1

## 2014-02-19 MED ORDER — CHLORDIAZEPOXIDE HCL 25 MG PO CAPS
25.0000 mg | ORAL_CAPSULE | Freq: Three times a day (TID) | ORAL | Status: AC
Start: 2014-02-20 — End: 2014-02-21
  Administered 2014-02-20 – 2014-02-21 (×3): 25 mg via ORAL
  Filled 2014-02-19 (×3): qty 1

## 2014-02-19 MED ORDER — CHLORDIAZEPOXIDE HCL 25 MG PO CAPS: 25.0000 mg | ORAL_CAPSULE | Freq: Four times a day (QID) | ORAL | Status: AC | PRN

## 2014-02-19 MED ORDER — NICOTINE 21 MG/24HR TD PT24
21.0000 mg | MEDICATED_PATCH | Freq: Once | TRANSDERMAL | Status: DC
  Filled 2014-02-19: qty 1

## 2014-02-19 MED ORDER — VITAMIN B-1 100 MG PO TABS
100.0000 mg | ORAL_TABLET | Freq: Every day | ORAL | Status: DC
  Administered 2014-02-20 – 2014-02-25 (×6): 100 mg via ORAL
  Filled 2014-02-19 (×8): qty 1

## 2014-02-19 MED ORDER — NICOTINE 21 MG/24HR TD PT24
21.0000 mg | MEDICATED_PATCH | Freq: Once | TRANSDERMAL | Status: DC
  Administered 2014-02-19: 21 mg via TRANSDERMAL
  Filled 2014-02-19: qty 1

## 2014-02-19 MED ORDER — EFAVIRENZ-EMTRICITAB-TENOFOVIR 600-200-300 MG PO TABS
1.0000 | ORAL_TABLET | Freq: Every day | ORAL | Status: DC
  Filled 2014-02-19: qty 1

## 2014-02-19 MED ORDER — THIAMINE HCL 100 MG/ML IJ SOLN
100.0000 mg | Freq: Once | INTRAMUSCULAR | Status: DC
  Filled 2014-02-19: qty 2

## 2014-02-19 MED ORDER — ALBUTEROL SULFATE HFA 108 (90 BASE) MCG/ACT IN AERS
2.0000 | INHALATION_SPRAY | Freq: Four times a day (QID) | RESPIRATORY_TRACT | Status: DC | PRN
  Administered 2014-02-20 – 2014-02-23 (×3): 2 via RESPIRATORY_TRACT
  Filled 2014-02-19: qty 6.7

## 2014-02-19 MED ORDER — CHLORDIAZEPOXIDE HCL 25 MG PO CAPS
25.0000 mg | ORAL_CAPSULE | Freq: Four times a day (QID) | ORAL | Status: AC
Start: 2014-02-19 — End: 2014-02-20
  Administered 2014-02-19 – 2014-02-20 (×3): 25 mg via ORAL
  Filled 2014-02-19 (×3): qty 1

## 2014-02-19 NOTE — Consult Note (Signed)
[facility] Face-to-Face Psychiatry Consult   Reason for Consult:  Alcohol dependency/detox Referring Physician:  ED MD Chase Caller is an 33 y.o. male. Total Time spent with patient: 20 minutes  Assessment: AXIS I:  Alcohol Abuse, Bipolar, Depressed and Substance Abuse AXIS II:  Deferred AXIS III:   Past Medical History  Diagnosis Date  . HIV infection   . Chronic back pain   . Bipolar 1 disorder   . Hemiplegia, unspecified, affecting unspecified side   . Abnormality of gait    AXIS IV:  other psychosocial or environmental problems, problems related to social environment and problems with primary support group AXIS V:  41-50 serious symptoms  Plan:  Recommend psychiatric Inpatient admission when medically cleared.  Dr. Sabra Heck assessed the patient and concurs with the treatment plan to admit for alcohol detox/dependency.  Subjective:   Joel Smith is a 33 y.o. male patient admitted with alcohol dependency/detox.  HPI:  Patient has been drinking large amounts of alcohol (any kind) for many years.  He has never had detox or rehab in the past.  Trek also has a history of bipolar disorder and was taking Invega IM monthly until a few months ago.  He did say it helped, he has heard voices in the past but not "for awhile".  HIV positive, CD4 counts are low despite patient stating he is compliant with his medications--patient at infectious disease at [facility].  Duane requests help for his alcohol dependency and cocaine/marijuana abuse. HPI Elements:   Location:  generalized. Quality:  acute. Severity:  severe. Timing:  constant. Duration:  years. Context:  stressors.  Past Psychiatric History: Past Medical History  Diagnosis Date  . HIV infection   . Chronic back pain   . Bipolar 1 disorder   . Hemiplegia, unspecified, affecting unspecified side   . Abnormality of gait     reports that he has been smoking Cigarettes.  He has a 9 pack-year smoking history. He has never used smokeless  tobacco. He reports that he drinks about one ounce of alcohol per week. He reports that he uses illicit drugs (Marijuana) about 14 times per week. Family History  Problem Relation Age of Onset  . Cancer Mother     breast cancer  . Hypertension Father   . High blood pressure Mother    Family History Substance Abuse: No Family Supports: Yes, List: Living Arrangements: Alone Can pt return to current living arrangement?: Yes Abuse/Neglect [facility]) Physical Abuse: Denies Verbal Abuse: Denies Sexual Abuse: Denies Allergies:   Allergies  Allergen Reactions  . Pineapple Swelling    ACT Assessment Complete:  Yes:    Educational Status    Risk to Self: Risk to self Suicidal Ideation: No Suicidal Intent: No Is patient at risk for suicide?: No Suicidal Plan?: No Access to Means: No What has been your use of drugs/alcohol within the last 12 months?: alcohol and cocaine and thc Previous Attempts/Gestures: No How many times?: 0 Other Self Harm Risks: na Triggers for Past Attempts: None known Intentional Self Injurious Behavior: None Family Suicide History: No Persecutory voices/beliefs?: No Depression: No Substance abuse history and/or treatment for substance abuse?: Yes Suicide prevention information given to non-admitted patients: Not applicable  Risk to Others: Risk to Others Homicidal Ideation: No Thoughts of Harm to Others: No Current Homicidal Intent: No Current Homicidal Plan: No Access to Homicidal Means: No Identified Victim: na History of harm to others?: No Assessment of Violence: None Noted Violent Behavior Description: cooperative Does  patient have access to weapons?: No Criminal Charges Pending?: No Does patient have a court date: No  Abuse: Abuse/Neglect Assessment (Assessment to be complete while patient is alone) Physical Abuse: Denies Verbal Abuse: Denies Sexual Abuse: Denies Exploitation of patient/patient's resources: Denies Self-Neglect: Denies  Prior  Inpatient Therapy: Prior Inpatient Therapy Prior Inpatient Therapy: No Prior Therapy Dates: na Prior Therapy Facilty/Provider(s): na Reason for Treatment: na  Prior Outpatient Therapy: Prior Outpatient Therapy Prior Outpatient Therapy: No Prior Therapy Dates: na Prior Therapy Facilty/Provider(s): na Reason for Treatment: na  Additional Information: Additional Information 1:1 In Past 12 Months?: No CIRT Risk: No Elopement Risk: No Does patient have medical clearance?: Yes                  Objective: Blood pressure 111/71, pulse 95, temperature 97.7 F (36.5 C), temperature source Oral, resp. rate 18, height 5' 11" (1.803 m), weight 205 lb (92.987 kg), SpO2 94.00%.Body mass index is 28.6 kg/(m^2). Results for orders placed during the hospital encounter of 02/18/14 (from the past 72 hour(s))  CBC WITH DIFFERENTIAL     Status: None   Collection Time    02/19/14  1:15 AM      Result Value Ref Range   WBC 9.0  4.0 - 10.5 K/uL   RBC 4.70  4.22 - 5.81 MIL/uL   Hemoglobin 14.9  13.0 - 17.0 g/dL   HCT 44.8  39.0 - 52.0 %   MCV 95.3  78.0 - 100.0 fL   MCH 31.7  26.0 - 34.0 pg   MCHC 33.3  30.0 - 36.0 g/dL   RDW 13.4  11.5 - 15.5 %   Platelets 299  150 - 400 K/uL   Neutrophils Relative % 46  43 - 77 %   Neutro Abs 4.1  1.7 - 7.7 K/uL   Lymphocytes Relative 43  12 - 46 %   Lymphs Abs 3.8  0.7 - 4.0 K/uL   Monocytes Relative 9  3 - 12 %   Monocytes Absolute 0.8  0.1 - 1.0 K/uL   Eosinophils Relative 2  0 - 5 %   Eosinophils Absolute 0.2  0.0 - 0.7 K/uL   Basophils Relative 0  0 - 1 %   Basophils Absolute 0.0  0.0 - 0.1 K/uL  COMPREHENSIVE METABOLIC PANEL     Status: Abnormal   Collection Time    02/19/14  1:15 AM      Result Value Ref Range   Sodium 140  137 - 147 mEq/L   Potassium 3.9  3.7 - 5.3 mEq/L   Chloride 102  96 - 112 mEq/L   CO2 18 (*) 19 - 32 mEq/L   Glucose, Bld 101 (*) 70 - 99 mg/dL   BUN 20  6 - 23 mg/dL   Creatinine, Ser 1.42 (*) 0.50 - 1.35 mg/dL    Calcium 9.2  8.4 - 10.5 mg/dL   Total Protein 8.0  6.0 - 8.3 g/dL   Albumin 4.0  3.5 - 5.2 g/dL   AST 28  0 - 37 U/L   Comment: SLIGHT HEMOLYSIS     HEMOLYSIS AT THIS LEVEL MAY AFFECT RESULT   ALT 28  0 - 53 U/L   Alkaline Phosphatase 78  39 - 117 U/L   Total Bilirubin <0.2 (*) 0.3 - 1.2 mg/dL   GFR calc non Af Amer 64 (*) >90 mL/min   GFR calc Af Amer 74 (*) >90 mL/min   Comment: (NOTE)  The eGFR has been calculated using the CKD EPI equation.     This calculation has not been validated in all clinical situations.     eGFR's persistently <90 mL/min signify possible Chronic Kidney     Disease.  ETHANOL     Status: Abnormal   Collection Time    02/19/14  1:15 AM      Result Value Ref Range   Alcohol, Ethyl (B) 167 (*) 0 - 11 mg/dL   Comment:            LOWEST DETECTABLE LIMIT FOR     SERUM ALCOHOL IS 11 mg/dL     FOR MEDICAL PURPOSES ONLY  URINE RAPID DRUG SCREEN (HOSP PERFORMED)     Status: Abnormal   Collection Time    02/19/14 10:04 AM      Result Value Ref Range   Opiates NONE DETECTED  NONE DETECTED   Cocaine POSITIVE (*) NONE DETECTED   Benzodiazepines NONE DETECTED  NONE DETECTED   Amphetamines NONE DETECTED  NONE DETECTED   Tetrahydrocannabinol POSITIVE (*) NONE DETECTED   Barbiturates NONE DETECTED  NONE DETECTED   Comment:            DRUG SCREEN FOR MEDICAL PURPOSES     ONLY.  IF CONFIRMATION IS NEEDED     FOR ANY PURPOSE, NOTIFY LAB     WITHIN 5 DAYS.                LOWEST DETECTABLE LIMITS     FOR URINE DRUG SCREEN     Drug Class       Cutoff (ng/mL)     Amphetamine      1000     Barbiturate      200     Benzodiazepine   009     Tricyclics       381     Opiates          300     Cocaine          300     THC              50   Labs are reviewed and are pertinent for medical issues being addressed.  Current Facility-Administered Medications  Medication Dose Route Frequency Provider Last Rate Last Dose  . efavirenz-emtricitabine-tenofovir (ATRIPLA)  600-200-300 MG per tablet 1 tablet  1 tablet Oral QHS Waylan Boga, NP      . LORazepam (ATIVAN) tablet 0-4 mg  0-4 mg Oral 4 times per day Garald Balding, NP       Followed by  . [START ON 02/21/2014] LORazepam (ATIVAN) tablet 0-4 mg  0-4 mg Oral Q12H Garald Balding, NP      . nicotine (NICODERM CQ - dosed in mg/24 hours) patch 21 mg  21 mg Transdermal Once Garald Balding, NP   21 mg at 02/19/14 0158  . valACYclovir (VALTREX) tablet 1,000 mg  1,000 mg Oral Daily Waylan Boga, NP   1,000 mg at 02/19/14 1024   Current Outpatient Prescriptions  Medication Sig Dispense Refill  . albuterol (PROVENTIL HFA;VENTOLIN HFA) 108 (90 BASE) MCG/ACT inhaler Inhale 2 puffs into the lungs every 6 (six) hours as needed for wheezing or shortness of breath.      . efavirenz-emtricitabine-tenofovir (ATRIPLA) 600-200-300 MG per tablet Take 1 tablet by mouth at bedtime.  90 tablet  3  . Paliperidone Palmitate (INVEGA SUSTENNA IM) Inject into the muscle every 30 (thirty) days.      Marland Kitchen  valACYclovir (VALTREX) 1000 MG tablet Take 1 tablet (1,000 mg total) by mouth daily.  30 tablet  5    Psychiatric Specialty Exam:     Blood pressure 111/71, pulse 95, temperature 97.7 F (36.5 C), temperature source Oral, resp. rate 18, height 5' 11" (1.803 m), weight 205 lb (92.987 kg), SpO2 94.00%.Body mass index is 28.6 kg/(m^2).  General Appearance: Casual  Eye Contact::  Fair  Speech:  Normal Rate  Volume:  Normal  Mood:  Depressed  Affect:  Congruent  Thought Process:  Coherent  Orientation:  Full (Time, Place, and Person)  Thought Content:  WDL  Suicidal Thoughts:  No  Homicidal Thoughts:  No  Memory:  Immediate;   Fair Recent;   Fair Remote;   Fair  Judgement:  Impaired  Insight:  Fair  Psychomotor Activity:  Decreased  Concentration:  Fair  Recall:  [employer] of Knowledge:Fair  Language: Good  Akathisia:  No  Handed:  Right  AIMS (if indicated):     Assets:  Leisure Time Resilience Social Support  Sleep:       Musculoskeletal: Strength & Muscle Tone: within normal limits Gait & Station: normal Patient leans: N/A  Treatment Plan Summary: Admit to [facility] [address] for alcohol detox/dependency.  Waylan Boga, PMH-NP 02/19/2014 12:15 PM Personally evaluated the patient, participated in the assessment and plan Geralyn Flash A. Sabra Heck, M.D.

## 2014-02-19 NOTE — BH Assessment (Signed)
Assessment Note  Joel Smith is an 33 y.o. male.   What led to pt coming to ER:  Pt came to ER to get detox from alcohol.  "I was tricked by sister to come."  Pt could not elaborate.  Pt denies suicidal and homicidal thoughts, plan or intent.  Pt denies hx of HI, SI and psychosis.  Pt consumes alcohol 1/2 gallon of liquor per day for 10 years.  Pt uses cocaine and THC when he can get it which amounts to "daily, if I can."  Pt denies military service or incarceration hx.  Pt was ambilivent about detox.  Pt was lethargic and drowsy while speaking to TTS.  Pt was polite but appeared to be still intoxicated and it's unclear if pt wants detox or will decline once more sober and alert.  Pt denies psych hx and and appears to be able to conduct ADLs.    Pt denied being depressed.  Pt made poor eye contact, appeared well nourished, speech soft, Ox3, sleepy, did not appear to be going thru detox at this time.  Recommendations:  Pt will be rounded on by psychiatry.  Axis I: Alcohol Abuse and Substance Abuse Axis II: Deferred Axis III:  Past Medical History  Diagnosis Date  . HIV infection   . Chronic back pain   . Bipolar 1 disorder   . Hemiplegia, unspecified, affecting unspecified side   . Abnormality of gait    Axis IV: other psychosocial or environmental problems and problems related to social environment Axis V: 51-60 moderate symptoms  Past Medical History:  Past Medical History  Diagnosis Date  . HIV infection   . Chronic back pain   . Bipolar 1 disorder   . Hemiplegia, unspecified, affecting unspecified side   . Abnormality of gait     Past Surgical History  Procedure Laterality Date  . Ankle surgery Right     Family History:  Family History  Problem Relation Age of Onset  . Cancer Mother     breast cancer  . Hypertension Father   . High blood pressure Mother     Social History:  reports that he has been smoking Cigarettes.  He has a 9 pack-year smoking  history. He has never used smokeless tobacco. He reports that he drinks about one ounce of alcohol per week. He reports that he uses illicit drugs (Marijuana) about 14 times per week.  Additional Social History:  Alcohol / Drug Use Pain Medications: na Prescriptions: na Over the Counter: na History of alcohol / drug use?: Yes Longest period of sobriety (when/how long): none Substance #1 Name of Substance 1: alcohol 1 - Age of First Use: teen 1 - Amount (size/oz): 1/2 gallon or more 1 - Frequency: daily 1 - Duration: 10+ years 1 - Last Use / Amount: 02-18-14 Substance #2 Name of Substance 2: Cocaine 2 - Age of First Use: 3820s 2 - Amount (size/oz): varies 2 - Frequency: daily 2 - Duration: years 2 - Last Use / Amount: 02-18-14 Substance #3 Name of Substance 3: THC 3 - Age of First Use: teen 3 - Amount (size/oz): varies 3 - Frequency: varies 3 - Duration: years 3 - Last Use / Amount: 02-18-14  CIWA: CIWA-Ar BP: 124/82 mmHg Pulse Rate: 97 Nausea and Vomiting: no nausea and no vomiting Tactile Disturbances: none Tremor: no tremor Auditory Disturbances: not present Paroxysmal Sweats: no sweat visible Visual Disturbances: not present Anxiety: no anxiety, at ease Headache, Fullness in Head: none present  Agitation: normal activity Orientation and Clouding of Sensorium: oriented and can do serial additions CIWA-Ar Total: 0 COWS:    Allergies:  Allergies  Allergen Reactions  . Pineapple Swelling    Home Medications:  (Not in a hospital admission)  OB/GYN Status:  No LMP for male patient.  General Assessment Data Location of Assessment: [facility] ED Is this a Tele or Face-to-Face Assessment?: Face-to-Face Is this an Initial Assessment or a Re-assessment for this encounter?: Initial Assessment Living Arrangements: Alone Can pt return to current living arrangement?: Yes Admission Status: Voluntary Is patient capable of signing voluntary admission?: Yes Transfer from: Acute  Hospital Referral Source: MD  Medical Screening Exam [facility] Walk-in ONLY) Medical Exam completed: Yes  [facility] Crisis Care Plan Living Arrangements: Alone Name of Psychiatrist: na Name of Therapist: na  Education Status Is patient currently in school?: No Current Grade: na Highest grade of school patient has completed: na Name of school: na Contact person: na  Risk to self Suicidal Ideation: No Suicidal Intent: No Is patient at risk for suicide?: No Suicidal Plan?: No Access to Means: No What has been your use of drugs/alcohol within the last 12 months?: alcohol and cocaine and thc Previous Attempts/Gestures: No How many times?: 0 Other Self Harm Risks: na Triggers for Past Attempts: None known Intentional Self Injurious Behavior: None Family Suicide History: No Persecutory voices/beliefs?: No Depression: No Substance abuse history and/or treatment for substance abuse?: No Suicide prevention information given to non-admitted patients: Not applicable  Risk to Others Homicidal Ideation: No Thoughts of Harm to Others: No Current Homicidal Intent: No Current Homicidal Plan: No Access to Homicidal Means: No Identified Victim: na History of harm to others?: No Assessment of Violence: None Noted Violent Behavior Description: cooperative Does patient have access to weapons?: No Criminal Charges Pending?: No Does patient have a court date: No  Psychosis Hallucinations: None noted Delusions: None noted  Mental Status Report Appear/Hygiene: Disheveled Eye Contact: Poor Motor Activity: Unremarkable Speech: Soft;Logical/coherent Level of Consciousness: Quiet/awake Mood: Anxious Affect: Anxious Anxiety Level: Minimal Thought Processes: Coherent Judgement: Unimpaired Orientation: Person;Place;Situation Obsessive Compulsive Thoughts/Behaviors: None  Cognitive Functioning Concentration: Decreased Memory: Recent Intact;Remote Intact IQ: Average Insight: Poor Impulse  Control: Poor Appetite: Good Weight Loss: 0 Weight Gain: 0 Sleep: No Change Total Hours of Sleep: 6 Vegetative Symptoms: None  ADLScreening [facility] Assessment Services) Patient's cognitive ability adequate to safely complete daily activities?: Yes Patient able to express need for assistance with ADLs?: Yes Independently performs ADLs?: Yes (appropriate for developmental age)  Prior Inpatient Therapy Prior Inpatient Therapy: No Prior Therapy Dates: na Prior Therapy Facilty/Provider(s): na Reason for Treatment: na  Prior Outpatient Therapy Prior Outpatient Therapy: No Prior Therapy Dates: na Prior Therapy Facilty/Provider(s): na Reason for Treatment: na  ADL Screening (condition at time of admission) Patient's cognitive ability adequate to safely complete daily activities?: Yes Is the patient deaf or have difficulty hearing?: No Does the patient have difficulty seeing, even when wearing glasses/contacts?: No Does the patient have difficulty concentrating, remembering, or making decisions?: No Patient able to express need for assistance with ADLs?: Yes Does the patient have difficulty dressing or bathing?: No Independently performs ADLs?: Yes (appropriate for developmental age) Does the patient have difficulty walking or climbing stairs?: No Weakness of Legs: None Weakness of Arms/Hands: None  Home Assistive Devices/Equipment Home Assistive Devices/Equipment: None  Therapy Consults (therapy consults require a physician order) PT Evaluation Needed: No OT Evalulation Needed: No SLP Evaluation Needed: No Abuse/Neglect Assessment (Assessment to be complete  while patient is alone) Physical Abuse: Denies Verbal Abuse: Denies Sexual Abuse: Denies Exploitation of patient/patient's resources: Denies Self-Neglect: Denies Values / Beliefs Cultural Requests During Hospitalization: None Spiritual Requests During Hospitalization: None Consults Spiritual Care Consult Needed:  No Social Work Consult Needed: No Merchant navy officer (For Healthcare) Advance Directive: Patient does not have advance directive Pre-existing out of facility DNR order (yellow form or pink MOST form): No    Additional Information 1:1 In Past 12 Months?: No CIRT Risk: No Elopement Risk: No Does patient have medical clearance?: Yes     Disposition:  Disposition Initial Assessment Completed for this Encounter: Yes Disposition of Patient: Referred to (psychiatry to round on pt)  On Site Evaluation by:   Reviewed with Physician:    Macon Large. 02/19/2014 8:30 AM

## 2014-02-19 NOTE — Progress Notes (Signed)
[facility] Group Notes:  (Nursing/MHT/Case Management/Adjunct)  Date:  02/19/2014  Time:  11:12 PM  Type of Therapy:  Group Therapy  Participation Level:  Did Not Attend  Participation Quality:  Did Not Attend  Affect:  Did Not Attend  Cognitive:  Did Not Attend  Insight:  None  Engagement in Group:  Did Not Attend  Modes of Intervention:  Socialization and Support  Summary of Progress/Problems: Pt. Was sleeping in bed.  Joel Smith 02/19/2014, 11:12 PM
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
## 2014-02-19 NOTE — Progress Notes (Signed)
[facility] and [facility] [place] [facility]  Pt alert and signed forms and [facility]

## 2014-02-19 NOTE — Progress Notes (Signed)
Writer attempted to assess but the patient was too drunk and he would not participate in the assessment.  Incoming staff will assess the patient.

## 2014-02-19 NOTE — ED Notes (Signed)
Up to the desk to call his family to bring his clothes.
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
## 2014-02-19 NOTE — ED Notes (Signed)
Pt arrived to the ED with a complaint of alcohol intoxication and would like to get detox.  Pt states he is detoxing off of alcohol, cocaine, marijuana  and [place]'s.  Pt's last use was today.

## 2014-02-19 NOTE — ED Notes (Signed)
Dr Dub Mikeslugo and Catha Nottinghamjamison np into see

## 2014-02-19 NOTE — ED Notes (Signed)
[facility]  Pt's family took his belongings prior to transfer to the unit.

## 2014-02-20 DIAGNOSIS — F1010 Alcohol abuse, uncomplicated: Secondary | ICD-10-CM

## 2014-02-20 DIAGNOSIS — F259 Schizoaffective disorder, unspecified: Secondary | ICD-10-CM | POA: Diagnosis present

## 2014-02-20 MED ORDER — NICOTINE 21 MG/24HR TD PT24
21.0000 mg | MEDICATED_PATCH | Freq: Every day | TRANSDERMAL | Status: DC
  Administered 2014-02-20 – 2014-02-25 (×6): 21 mg via TRANSDERMAL
  Filled 2014-02-20 (×7): qty 1

## 2014-02-20 MED ORDER — NAPROXEN 500 MG PO TABS
500.0000 mg | ORAL_TABLET | Freq: Two times a day (BID) | ORAL | Status: DC
  Administered 2014-02-20 – 2014-02-25 (×11): 500 mg via ORAL
  Filled 2014-02-20 (×14): qty 1

## 2014-02-20 MED ORDER — PALIPERIDONE ER 6 MG PO TB24
6.0000 mg | ORAL_TABLET | Freq: Every day | ORAL | Status: DC
  Administered 2014-02-20 – 2014-02-25 (×6): 6 mg via ORAL
  Filled 2014-02-20 (×8): qty 1

## 2014-02-20 NOTE — [facility] Group Notes (Signed)
[facility] Group Notes:  (Nursing/MHT/Case Management/Adjunct)  Date:  02/20/2014  Time:  0900  Type of Therapy: Self Inventory  Participation Level:  Did Not Attend   Cresenciano GenreCaroline Evans [facility] 02/20/2014, 10:12 AM

## 2014-02-20 NOTE — Progress Notes (Signed)
Psychoeducational Group Note  Date:  02/20/2014 Time:  0945 am  Group Topic/Focus:  Making Healthy Choices:   The focus of this group is to help patients identify negative/unhealthy choices they were using prior to admission and identify positive/healthier coping strategies to replace them upon discharge.  Participation Level:  Active  Participation Quality:  Appropriate and Sharing  Affect:  Appropriate  Cognitive:  Alert and Appropriate  Insight:  Developing/Improving  Engagement in Group:  Developing/Improving  Additional Comments:  Video on chemical dependency and discussion  Andrena Mewsuttall, Adalei Novell J 02/20/2014, 1:15 pm

## 2014-02-20 NOTE — Progress Notes (Signed)
D   Pt reports poor sleep even after requesting sleep medication and reports a low energy level   He reports improving appetite and improved ability to pay attention    His withdrawal symptoms include cravings agitation dizziness headaches and blurred vision   He rates his depression and hopelessness at a 4   He denies suicidal ideation   He is also complaining of needing a cigarette   He uses a wheelchair due to some weakness and unsteadiness A   Verbal support given   Medications administered and effectiveness monitored   Q 15 min checks  Reinforce fall precautions   Encouraged pt to speak with doctor regarding pain management R   Pt safe at present and verbalized understanding

## 2014-02-20 NOTE — Progress Notes (Signed)
D: Pt denies SI/HI/AVH. Pt is pleasant and cooperative. Pt slept most of the evening, but got up to get a snack and take his meds.   A: Pt was offered support and encouragement. Pt was given scheduled medications. Pt was encourage to attend groups. Q 15 minute checks were done for safety.   R: Pt is taking medication. Pt has no complaints at this time.Pt receptive to treatment and safety maintained on unit.

## 2014-02-20 NOTE — Progress Notes (Signed)
Adult Psychoeducational Group Note  Date:  02/20/2014 Time:  4:04 PM  Group Topic/Focus:  Making Healthy Choices:   The focus of this group is to help patients identify negative/unhealthy choices they were using prior to admission and identify positive/healthier coping strategies to replace them upon discharge.  Participation Level:  Did Not Attend   Joel MilesBrittany N Connie Hilgert 02/20/2014, 4:04 PM
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
## 2014-02-20 NOTE — Progress Notes (Signed)
Patient did attend the evening speaker AA meeting. Pt entered group late but stayed until the end.

## 2014-02-20 NOTE — [facility] Group Notes (Signed)
[facility] LCSW Group Therapy No    02/20/2014 10:00 AM  Type of Therapy and Topic:  Group Therapy: Stages of Change and Self-Sabotaging, Enabling Behaviors   Participation Level:  Did Not Attend     Raechel Marcos C Abdiel Blackerby, LCSW 

## 2014-02-21 ENCOUNTER — Other Ambulatory Visit: Payer: Medicare Other

## 2014-02-21 DIAGNOSIS — F39 Unspecified mood [affective] disorder: Secondary | ICD-10-CM

## 2014-02-21 DIAGNOSIS — F1410 Cocaine abuse, uncomplicated: Secondary | ICD-10-CM | POA: Diagnosis present

## 2014-02-21 DIAGNOSIS — F1910 Other psychoactive substance abuse, uncomplicated: Secondary | ICD-10-CM

## 2014-02-21 DIAGNOSIS — F1994 Other psychoactive substance use, unspecified with psychoactive substance-induced mood disorder: Secondary | ICD-10-CM

## 2014-02-21 DIAGNOSIS — F1210 Cannabis abuse, uncomplicated: Secondary | ICD-10-CM | POA: Diagnosis present

## 2014-02-21 DIAGNOSIS — F329 Major depressive disorder, single episode, unspecified: Secondary | ICD-10-CM | POA: Diagnosis present

## 2014-02-21 MED ORDER — CYCLOBENZAPRINE HCL 10 MG PO TABS
10.0000 mg | ORAL_TABLET | Freq: Three times a day (TID) | ORAL | Status: DC
  Administered 2014-02-21 – 2014-02-22 (×3): 10 mg via ORAL
  Filled 2014-02-21 (×7): qty 1

## 2014-02-21 MED ORDER — PANTOPRAZOLE SODIUM 40 MG PO TBEC
40.0000 mg | DELAYED_RELEASE_TABLET | Freq: Every day | ORAL | Status: DC
  Administered 2014-02-21 – 2014-02-25 (×5): 40 mg via ORAL
  Filled 2014-02-21 (×10): qty 1

## 2014-02-21 NOTE — Progress Notes (Signed)
Patient ID: Joel Smith, male   DOB: 11-Jan-1981, 33 y.o.   MRN: [id number]  Fall DAR Note:  D: At 11:15 patient reported that 5 minutes previously he had fallen out of his wheelchair. Patient was stretching in wheelchair and his bottom slid out and hit his bottom on the foot pedals. Patient was able to return to his seated position. Patient was outside at the time of the incident. Patient was returned to the unit and vitals were taken. See doc flowsheets for information. Patient was alert and oriented with no skin issues or any new onset of pain. MD was informed at 11:34 am 02/21/2014. After fall there were no changes in condition or cognition. Patient reported no change when he fell. Patient reported, "I know how to fall safely."  A: Vitals were taken after fall and two hours post fall. Skin assessment was done. New fall assessment was done to reflect this change.   R: No issues with this patient after fall.    [facility] [place] [facility] q4 hour X 24 hour vitals were discontinued. Patient has had no changes after the fall.

## 2014-02-21 NOTE — [facility] Group Notes (Signed)
[facility] LCSW Group Therapy  02/21/2014 4:22 PM  Type of Therapy:  Group Therapy  Participation Level:  Did Not Attend-pt sleeping in room during group.   Harvie Morua Smart LCSWA  02/21/2014, 4:22 PM

## 2014-02-21 NOTE — Progress Notes (Signed)
Adult Psychoeducational Group Note  Date:  02/21/2014 Time:  10:00 am  Group Topic/Focus:  Wellness Toolbox:   The focus of this group is to discuss various aspects of wellness, balancing those aspects and exploring ways to increase the ability to experience wellness.  Patients will create a wellness toolbox for use upon discharge.  Participation Level:  Did Not Attend  Modena NunneryLamount Dorse Locy 02/21/2014, 6:36 PM

## 2014-02-21 NOTE — [facility] Suicide Risk Assessment (Signed)
[facility] INPATIENT: Family/Significant Other Suicide Prevention Education  Suicide Prevention Education:  Education Completed; No one has been identified by the patient as the family member/significant other with whom the patient will be residing, and identified as the person(s) who will aid the patient in the event of a mental health crisis (suicidal ideations/suicide attempt).   Pt did not c/o SI at admission, nor have they endorsed SI during their stay here. SPE not required. SPI pamphlet provided to pt and he was encouraged to share information with support network, ask questions, and talk about any concerns relating to SPE.    Joel Smith, LCSWA 02/21/2014 12:51 PM   
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
## 2014-02-21 NOTE — [facility] Group Notes (Signed)
[facility]   02/21/2014 11:24 AM  Participation Quality:  DID NOT ATTEND-pt sleeping in room during group.   Gildardo Tickner Smart Am[employer] IncLCSWA

## 2014-02-21 NOTE — Progress Notes (Signed)
Patient ID: Keene BreathCharles L Pescador, male   DOB: 1981-05-02, 33 y.o.   MRN: [id number]  D: Pt. Denies SI/HI and A/V Hallucinations. Patient did not turn in daily inventory sheet today. Patient reports chronic back and leg pain and received medication for this complaint.   A: Support and encouragement provided to the patient. Patient had a fall occur today and was  Scheduled medications given to patient per physician's orders.  R: Patient is receptive and cooperative but minimal. Patient encouraged to go to groups but has been in bed sleeping. Q15 minute checks are maintained for safety.
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
## 2014-02-21 NOTE — Progress Notes (Signed)
D: Pt denies SI/HI/AVH. Pt is pleasant and cooperative. Pt came to group late tonight. Pt said getting a little better, complaining of night sweats though.   A: Pt was offered support and encouragement. Pt was given scheduled medications. Pt was encourage to attend groups. Q 15 minute checks were done for safety.   R:Pt attends groups and interacts well with peers and staff. Pt is taking medication. Pt has no complaints at this time.Pt receptive to treatment and safety maintained on unit.

## 2014-02-22 MED ORDER — CYCLOBENZAPRINE HCL 10 MG PO TABS
10.0000 mg | ORAL_TABLET | Freq: Every day | ORAL | Status: DC
  Administered 2014-02-22 – 2014-02-23 (×2): 10 mg via ORAL
  Filled 2014-02-22 (×4): qty 1

## 2014-02-22 MED ORDER — CYCLOBENZAPRINE HCL 10 MG PO TABS
10.0000 mg | ORAL_TABLET | Freq: Every day | ORAL | Status: AC
  Administered 2014-02-23: 10 mg via ORAL
  Filled 2014-02-22: qty 1

## 2014-02-22 NOTE — Progress Notes (Signed)
D:Pt is silly interacting on the unit. Pt denies any detox symptoms at this time and is preparing to be transported to have his rt knee evaluated for a brace. Pt is currently using a wheelchair for his unsteady gait.  A:Offered support, encouragement and 15 minute checks.  R:Pt denies si and hi. Safety maintained on the unit.

## 2014-02-22 NOTE — [facility] Group Notes (Signed)
[facility] Group Notes:  (Nursing/MHT/Case Management/Adjunct)  Date:  02/22/2014  Time: 0900 am  Type of Therapy:  Psychoeducational Skills  Participation Level:  Did Not Attend   Joel Smith 02/22/2014, 9:49 AM 

## 2014-02-22 NOTE — BHH Group Notes (Signed)
[facility] LCSW Group Therapy  02/22/2014   1:15 PM   Type of Therapy:  Group Therapy  Participation Level:  Active  Participation Quality:  Attentive, Sharing and Supportive  Affect:  Depressed and Flat  Cognitive:  Alert and Oriented  Insight:  Developing/Improving and Engaged  Engagement in Therapy:  Developing/Improving and Engaged  Modes of Intervention:  Activity, Clarification, Confrontation, Discussion, Education, Exploration, Limit-setting, Orientation, Problem-solving, Rapport Building, Reality Testing, Socialization and Support  Summary of Progress/Problems: Patient was attentive and engaged with speaker from [employer].  Patient was attentive to speaker while they shared their story of dealing with mental health and overcoming it.  Patient expressed interest in their programs and services and received information on their agency.  Patient processed ways they can relate to the speaker.     Joel Willems Horton, LCSW 02/22/2014  1:55 PM       
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
## 2014-02-22 NOTE — Progress Notes (Signed)
Recreation Therapy Notes  Animal-Assisted Activity/Therapy (AAA/T) Program Checklist/Progress Notes Patient Eligibility Criteria Checklist & Daily Group note for Rec Tx Intervention  Date: 04.14.2015 Time: 2:45am Location: [address]    AAA/T Program Assumption of Risk Form signed by Patient/ or Parent Legal Guardian yes  Patient is free of allergies or sever asthma yes  Patient reports no fear of animals yes  Patient reports no history of cruelty to animals yes   Patient understands his/her participation is voluntary yes  Behavioral Response: Did not attend.   Joel Smith L [name], LRT/CTRS  [name] L [name] 02/22/2014 4:56 PM 

## 2014-02-22 NOTE — Progress Notes (Signed)
D: Pt denies SI/HI/AVH. Pt is pleasant and cooperative. Pt upon approach was walking with cane and writer noticed pt gait was very unsteady.   A: Pt was offered support and encouragement. Pt was given scheduled medications. Pt was encourage to attend groups. Q 15 minute checks were done for safety. Pt informed if he was going to ambulate to use the wheelchair.   R:Pt attends groups and interacts well with peers and staff. Pt is taking medication. Pt has no complaints at this time .Pt receptive to treatment and safety maintained on unit. Pt verbalized understanding to  ambulate only with wheelchair.

## 2014-02-22 NOTE — Progress Notes (Signed)
Patient ID: Joel Smith, male   DOB: [birth year], 33 y.o.   MRN: [id number] [facility] MD Progress Note  02/22/2014 1:46 PM Joel Smith  MRN:  [id number]  Subjective:  [name] states that he is still struggling with his substance abuse issues as well as his inability to walk properly. He says he is doing okay, just frustrated. Had asked for his mood control medicines Invega to be changed to injectable format. He says he did well on it and his last use was 3 weeks ago. He says he was getting it Q monthly at the time. Wants his flexeril dosing times to be 8 am, 1700 PM and 2200 pm respectively. Currently denies SIHI, AVH.  Diagnosis:   DSM5: Schizophrenia Disorders:  none Obsessive-Compulsive Disorders:  none Trauma-Stressor Disorders:  none Substance/Addictive Disorders:  Alcohol Related Disorder - Severe (303.90), Cocaine related disorder, Cannabis related disorder Depressive Disorders:  Major Depressive Disorder - Moderate (296.22) Total Time spent with patient: 30 minutes  Axis I: Mood Disorder NOS and Substance Induced Mood Disorder  ADL's:  Intact  Sleep: Fair  Appetite:  Fair  Suicidal Ideation:  Plan:  denies Intent:  denies Means:  denies  Homicidal Ideation:  Plan:  denies Intent:  denies Means:  denies  AEB (as evidenced by):  Psychiatric Specialty Exam: Physical Exam  Review of Systems  Constitutional: Positive for malaise/fatigue.  HENT: Negative.   Eyes: Negative.   Respiratory: Negative.   Cardiovascular: Negative.   Gastrointestinal: Positive for heartburn.  Musculoskeletal: Positive for back pain and joint pain.  Skin: Negative.   Neurological: Positive for weakness.  Endo/Heme/Allergies: Negative.   Psychiatric/Behavioral: Positive for depression and substance abuse. The patient is nervous/anxious and has insomnia.     Blood pressure 111/77, pulse 71, temperature 97.4 F (36.3 C), temperature source Oral, resp. rate 16, height 5\' 11"  (1.803 m), weight  105.688 kg (233 lb), SpO2 99.00%.Body mass index is 32.51 kg/(m^2).  General Appearance: Fairly Groomed  Patent attorney::  Fair  Speech:  Clear and Coherent and Slow  Volume:  Decreased  Mood:  Anxious and Depressed  Affect:  Restricted  Thought Process:  Coherent and Goal Directed  Orientation:  Full (Time, Place, and Person)  Thought Content:  symptoms, worries, concerns  Suicidal Thoughts:  No  Homicidal Thoughts:  No  Memory:  Immediate;   Fair Recent;   Fair Remote;   Fair  Judgement:  Fair  Insight:  Present and Shallow  Psychomotor Activity:  Psychomotor Retardation  Concentration:  Fair  Recall:  [employer] of Knowledge:NA  Language: Fair  Akathisia:  No  Handed:    AIMS (if indicated):     Assets:  Desire for Improvement, Resiliant  Sleep:  Number of Hours: 6.5   Musculoskeletal: Strength & Muscle Tone: decreased Gait & Station: unsteady, needs cane Patient leans: N/A  Current Medications: Current Facility-Administered Medications  Medication Dose Route Frequency Provider Last Rate Last Dose  . acetaminophen (TYLENOL) tablet 650 mg  650 mg Oral Q6H PRN Nanine Means, NP   650 mg at 02/21/14 2204  . albuterol (PROVENTIL HFA;VENTOLIN HFA) 108 (90 BASE) MCG/ACT inhaler 2 puff  2 puff Inhalation Q6H PRN Nanine Means, NP   2 puff at 02/21/14 1327  . alum & mag hydroxide-simeth (MAALOX/MYLANTA) 200-200-20 MG/5ML suspension 30 mL  30 mL Oral Q4H PRN Nanine Means, NP      . chlordiazePOXIDE (LIBRIUM) capsule 25 mg  25 mg Oral Q6H PRN Nanine Means, NP      . [  START ON 02/23/2014] chlordiazePOXIDE (LIBRIUM) capsule 25 mg  25 mg Oral Daily Nanine MeansJamison Lord, NP      . cyclobenzaprine (FLEXERIL) tablet 10 mg  10 mg Oral TID Rachael FeeIrving A Lugo, MD   10 mg at 02/22/14 0858  . efavirenz-emtricitabine-tenofovir (ATRIPLA) 600-200-300 MG per tablet 1 tablet  1 tablet Oral QHS Nanine MeansJamison Lord, NP   1 tablet at 02/21/14 2205  . hydrOXYzine (ATARAX/VISTARIL) tablet 25 mg  25 mg Oral Q6H PRN Nanine MeansJamison  Lord, NP      . hydrOXYzine (ATARAX/VISTARIL) tablet 50 mg  50 mg Oral QHS PRN,MR X 1 Court Joyharles E Kober, PA-C   50 mg at 02/21/14 2204  . loperamide (IMODIUM) capsule 2-4 mg  2-4 mg Oral PRN Nanine MeansJamison Lord, NP      . magnesium hydroxide (MILK OF MAGNESIA) suspension 30 mL  30 mL Oral Daily PRN Nanine MeansJamison Lord, NP      . multivitamin with minerals tablet 1 tablet  1 tablet Oral Daily Nanine MeansJamison Lord, NP   1 tablet at 02/22/14 0850  . naproxen (NAPROSYN) tablet 500 mg  500 mg Oral BID WC Mojeed Akintayo   500 mg at 02/22/14 0850  . nicotine (NICODERM CQ - dosed in mg/24 hours) patch 21 mg  21 mg Transdermal Daily Rachael FeeIrving A Lugo, MD   21 mg at 02/22/14 0859  . ondansetron (ZOFRAN-ODT) disintegrating tablet 4 mg  4 mg Oral Q6H PRN Nanine MeansJamison Lord, NP   4 mg at 02/21/14 1147  . paliperidone (INVEGA) 24 hr tablet 6 mg  6 mg Oral Daily Mojeed Akintayo   6 mg at 02/22/14 0852  . pantoprazole (PROTONIX) EC tablet 40 mg  40 mg Oral Daily Rachael FeeIrving A Lugo, MD   40 mg at 02/22/14 [id number]  . thiamine (B-1) injection 100 mg  100 mg Intramuscular Once Nanine MeansJamison Lord, NP      . thiamine (VITAMIN B-1) tablet 100 mg  100 mg Oral Daily Nanine MeansJamison Lord, NP   100 mg at 02/22/14 0852  . valACYclovir (VALTREX) tablet 1,000 mg  1,000 mg Oral Daily Nanine MeansJamison Lord, NP   1,000 mg at 02/22/14 [id number]    Lab Results: No results found for this or any previous visit (from the past 48 hour(s)).  Physical Findings: AIMS: Facial and Oral Movements Muscles of Facial Expression: None, normal Lips and Perioral Area: None, normal Jaw: None, normal Tongue: None, normal,Extremity Movements Upper (arms, wrists, hands, fingers): None, normal Lower (legs, knees, ankles, toes): None, normal, Trunk Movements Neck, shoulders, hips: None, normal, Overall Severity Severity of abnormal movements (highest score from questions above): None, normal Incapacitation due to abnormal movements: None, normal Patient's awareness of abnormal movements (rate only patient's  report): No Awareness,    CIWA:  CIWA-Ar Total: 2 COWS:     Treatment Plan Summary: Daily contact with patient to assess and evaluate symptoms and progress in treatment Medication management  Plan: 1. Continue crisis management and stabilization.  2. Medication management: Continue Librium protocols for benzodiazepine, alcohol detox, Hydroxyzine 25 mg Q 6 hours prn for anxiety, Invega 6 mg Q bedtime for mood control/mood stabilization.  3. Encouraged patient to attend groups and participate in group counseling sessions and activities.  4. Address health issues: Vitals reviewed and stable. Reschedules Flexeril 10 mg dosing times per patient's request. 5. Discharge plan in progress. 6. Continue current treatment plan.     Medical Decision Making Problem Points:  Review of psycho-social stressors (1) Data Points:  Review of medication regiment & side  effects (2) Review of new medications or change in dosage (2)  I certify that inpatient services furnished can reasonably be expected to improve the patient's condition.   Sanjuana KavaAgnes I Nwoko, PMHNP, FNP-BC 02/22/2014, 1:46 PM

## 2014-02-22 NOTE — [facility] Group Notes (Signed)
Adult Psychoeducational Group Note  Date:  02/22/2014 Time:  10:34 PM  Group Topic/Focus:  AA Meeting  Participation Level:  Minimal  Participation Quality:  Attentive  Affect:  Appropriate  Cognitive:  Appropriate  Insight: Good  Engagement in Group:  Limited  Modes of Intervention:  Discussion and Education  Additional Comments:  Joel Smith attended AA group.  Joel Smith A Lillia AbedLindsay 02/22/2014, 10:34 PM

## 2014-02-22 NOTE — Progress Notes (Signed)
Patient ID: Joel BreathCharles L Westerfield, male   DOB: 11/24/1980, 33 y.o.   MRN: [id number]  D: Pt was soft spoken and at times difficult to understand. Pt was positive for groups and interacting appropriately with his peers. Pt is aware of his scheduled apt this morning.  A:  Support and encouragement was offered. 15 min checks continued for safety.  R: Pt remains safe.

## 2014-02-23 NOTE — Progress Notes (Signed)
[facility]  02/23/2014 3:51 PM Keene BreathCharles L Pritt  MRN:  [id number] Subjective:  [name] is trying to get his life back together. [facility]. He will not be guarantee a bed if he does not go door to door. He is still feeling down, gets overwhelmed. Still dealing with the losses in his life. He slept better last night.  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  HENT: Negative.   Eyes: Negative.   Respiratory: Negative.   Cardiovascular: Negative.   Gastrointestinal: Negative.   Genitourinary: Negative.   Musculoskeletal: Positive for back pain and joint pain.  Skin: Negative.   Neurological: Positive for weakness.  Endo/Heme/Allergies: Negative.   Psychiatric/Behavioral: Positive for depression and substance abuse. The patient is nervous/anxious.     Blood pressure 121/87, pulse 90, temperature 97.5 F (36.4 C), temperature source Oral, resp. rate 16, height 5\' 11"  (1.803 m), weight 105.688 kg (233 lb), SpO2 99.00%.Body mass index is 32.51 kg/(m^2).  General Appearance: Fairly Groomed  Patent attorneyye Contact::  Fair  Speech:  Slow and Slurred  Volume:  Decreased  Mood:  Anxious and worried  Affect:  Restricted  Thought Process:  Coherent and Goal Directed   Orientation:  Full (Time, Place, and Person)  Thought Content:  symptoms worries concerns  Suicidal Thoughts:  No  Homicidal Thoughts:  No  Memory:  Immediate;   Fair Recent;   Fair Remote;   Fair  Judgement:  Fair  Insight:  Present and Shallow  Psychomotor Activity:  Restlessness  Concentration:  Fair  Recall:  [employer]Fair  Fund of Knowledge:NA  Language: Poor  Akathisia:  No  Handed:    AIMS (if indicated):     Assets:  Desire for Improvement  Sleep:  Number of Hours: 6.5   Musculoskeletal: Strength & Muscle Tone: spastic and decreased Gait & Station: unsteady Patient leans: N/A  Current Medications: Current Facility-Administered Medications  Medication Dose Route Frequency Provider Last Rate Last Dose  . acetaminophen (TYLENOL) tablet 650 mg  650 mg Oral Q6H PRN Nanine MeansJamison Lord, NP   650 mg at 02/22/14 2238  . albuterol (PROVENTIL HFA;VENTOLIN HFA) 108 (90 BASE) MCG/ACT inhaler 2 puff  2 puff Inhalation Q6H PRN Nanine MeansJamison Lord, NP   2 puff at 02/21/14 1327  . alum & mag hydroxide-simeth (MAALOX/MYLANTA) 200-200-20 MG/5ML suspension 30 mL  30 mL Oral Q4H PRN Nanine MeansJamison Lord, NP   30 mL at 02/22/14 2241  . cyclobenzaprine (FLEXERIL) tablet 10 mg  10 mg Oral QAC supper Sanjuana KavaAgnes I Nwoko, NP   10 mg at 02/22/14 1720  . cyclobenzaprine (FLEXERIL) tablet 10 mg  10 mg Oral QHS Sanjuana KavaAgnes I Nwoko, NP   10 mg at 02/22/14 2234  . efavirenz-emtricitabine-tenofovir (ATRIPLA) 600-200-300 MG per tablet 1 tablet  1 tablet Oral QHS Nanine MeansJamison Lord, NP   1 tablet at 02/22/14  2234  . hydrOXYzine (ATARAX/VISTARIL) tablet 50 mg  50 mg Oral QHS PRN,MR X 1 Court Joyharles E Kober, PA-C   50 mg at 02/23/14 0020  . magnesium hydroxide (MILK OF MAGNESIA) suspension 30 mL  30 mL Oral Daily PRN Nanine MeansJamison Lord, NP      . multivitamin with minerals tablet 1 tablet  1 tablet Oral Daily Nanine MeansJamison Lord, NP   1 tablet at 02/23/14 0802  . naproxen (NAPROSYN) tablet 500 mg  500 mg Oral BID WC Mojeed Akintayo   500 mg at 02/23/14 0802  . nicotine  (NICODERM CQ - dosed in mg/24 hours) patch 21 mg  21 mg Transdermal Daily Rachael FeeIrving A Waver Dibiasio, MD   21 mg at 02/23/14 [id number]  . paliperidone (INVEGA) 24 hr tablet 6 mg  6 mg Oral Daily Mojeed Akintayo   6 mg at 02/23/14 0802  . pantoprazole (PROTONIX) EC tablet 40 mg  40 mg Oral Daily Rachael FeeIrving A Ivo Moga, MD   40 mg at 02/23/14 0802  . thiamine (B-1) injection 100 mg  100 mg Intramuscular Once Nanine MeansJamison Lord, NP      . thiamine (VITAMIN B-1) tablet 100 mg  100 mg Oral Daily Nanine MeansJamison Lord, NP   100 mg at 02/23/14 0802  . valACYclovir (VALTREX) tablet 1,000 mg  1,000 mg Oral Daily Nanine MeansJamison Lord, NP   1,000 mg at 02/23/14 [id number]    Lab Results: No results found for this or any previous visit (from the past 48 hour(s)).  Physical Findings: AIMS: Facial and Oral Movements Muscles of Facial Expression: None, normal Lips and Perioral Area: None, normal Jaw: None, normal Tongue: None, normal,Extremity Movements Upper (arms, wrists, hands, fingers): None, normal Lower (legs, knees, ankles, toes): None, normal, Trunk Movements Neck, shoulders, hips: None, normal, Overall Severity Severity of abnormal movements (highest score from questions above): None, normal Incapacitation due to abnormal movements: None, normal Patient's awareness of abnormal movements (rate only patient's report): No Awareness,    CIWA:  CIWA-Ar Total: 0 COWS:     Treatment Plan Summary: Daily contact with patient to assess and evaluate symptoms and progress in treatment Medication management  Plan: Supportive approach/coping skills/relapse prevention           Detox as needed           Optimize treatment with psychotropics  Medical Decision Making Problem Points:  Review of psycho-social stressors (1) Data Points:  Review of medication regiment & side effects (2)  I certify that inpatient services furnished can reasonably be expected to improve the patient's condition.   Rachael Feerving A Ezmae Speers 02/23/2014, 3:51 PM

## 2014-02-23 NOTE — [facility] Group Notes (Signed)
[facility]   02/23/2014  8:45 AM  Participation Quality:  Did Not Attend - pt sleeping in his room  Joel IvanChelsea Horton, LCSW 02/23/2014 9:36 AM

## 2014-02-23 NOTE — Progress Notes (Signed)
Attended group 

## 2014-02-23 NOTE — [facility] Group Notes (Signed)
[facility] LCSW Group Therapy  02/23/2014 1:15 PM   Type of Therapy:  Group Therapy  Participation Level:  Did Not Attend - pt sleeping in his room  Joel IvanChelsea Horton, LCSW 02/23/2014 2:32 PM

## 2014-02-23 NOTE — Tx Team (Signed)
Interdisciplinary Treatment Plan Update (Adult)  Date: 02/23/2014  Time Reviewed:  9:45 AM  Progress in Treatment: Attending groups: Yes Participating in groups:  Yes Taking medication as prescribed:  Yes Tolerating medication:  Yes Family/Significant othe contact made: No, N/A Patient understands diagnosis:  Yes Discussing patient identified problems/goals with staff:  Yes Medical problems stabilized or resolved:  Yes Denies suicidal/homicidal ideation: Yes Issues/concerns per patient self-inventory:  Yes Other:  New problem(s) identified: N/A  Discharge Plan or Barriers: CSW is working on [facility] and has follow up with [facility] for outpatient treatment after inpatient.    Reason for Continuation of Hospitalization: Anxiety Depression Medication Stabilization Detox  Comments: N/A  Estimated length of stay: 2-3 days  For review of initial/current patient goals, please see plan of care.  Attendees: Patient:     Family:     Physician:  Dr. Dub MikesLugo 02/23/2014 10:19 AM   Nursing:   Marzetta Boardhrista Dopson, RN 02/23/2014 10:19 AM   Clinical Social Worker:  Reyes Ivanhelsea Horton, LCSW 02/23/2014 10:19 AM   Other: Onnie BoerJennifer Clark, RN case manager 02/23/2014 10:19 AM   Other:  Trula SladeHeather Smart, LCSWA 02/23/2014 10:19 AM   Other:  Serena ColonelAggie Nwoko, NP 02/23/2014 10:19 AM   Other:  Robbie LouisVivian Kent, RN 02/23/2014 10:19 AM   Other: Tomasita Morrowelora Sutton, care coordinator 02/23/2014 10:20 AM   Other: Michelle NasutiElena, pharmacist 02/23/2014 10:20 AM   Other:    Other:    Other:    Other:     Scribe for Treatment Team:   Carmina MillerHorton, Barbarita Hutmacher Nicole, 02/23/2014 , 10:19 AM

## 2014-02-23 NOTE — Progress Notes (Signed)
Pt reports his sleep as well. His appetite is good energy low and ability to pay attention as improving. He rated his  depression a 6 hopelessness 0 and anxiety 5 on his self-inventory.  He denies any S/H ideation or A/V/H. He wants to go in-patient from here.

## 2014-02-24 MED ORDER — CYCLOBENZAPRINE HCL 10 MG PO TABS: 10.0000 mg | ORAL_TABLET | Freq: Three times a day (TID) | ORAL | Status: DC

## 2014-02-24 MED ORDER — CYCLOBENZAPRINE HCL 10 MG PO TABS
10.0000 mg | ORAL_TABLET | Freq: Three times a day (TID) | ORAL | Status: DC
  Filled 2014-02-24: qty 1

## 2014-02-24 MED ORDER — CYCLOBENZAPRINE HCL 10 MG PO TABS
10.0000 mg | ORAL_TABLET | Freq: Three times a day (TID) | ORAL | Status: DC
  Administered 2014-02-24 – 2014-02-25 (×3): 10 mg via ORAL
  Filled 2014-02-24 (×9): qty 1

## 2014-02-24 NOTE — Progress Notes (Signed)
Patient ID: Joel Smith, male   DOB: 1981/05/03, 33 y.o.   MRN: [id number] PER STATE REGULATIONS 482.30  THIS CHART WAS REVIEWED FOR MEDICAL NECESSITY WITH RESPECT TO THE PATIENT'S ADMISSION/ DURATION OF STAY.  NEXT REVIEW DATE: 02/26/2014  Willa RoughJENNIFER JONES Makenley Shimp, RN, BSN CASE MANAGER

## 2014-02-24 NOTE — Progress Notes (Signed)
Adult Psychoeducational Group Note  Date:  02/24/2014 Time:  11:12 PM  Group Topic/Focus:  Wrap-Up Group:   The focus of this group is to help patients review their daily goal of treatment and discuss progress on daily workbooks.  Participation Level:  Minimal  Participation Quality:  Appropriate  Affect:  Appropriate  Cognitive:  Appropriate  Insight: Appropriate  Engagement in Group:  Engaged  Modes of Intervention:  Activity  Additional Comments:  Pt attended karaoke group.  Danielle RankinLequisha D Cornie Smith 02/24/2014, 11:12 PM

## 2014-02-24 NOTE — Progress Notes (Signed)
Patient ID: Joel Smith, male   DOB: 1981/02/17, 33 y.o.   MRN: [id number]  D: Patient has a flat affect on approach but reports mood ok at present. Denies SI at this time. Using wheelchair for ambulation this am. No c/o withdrawals at this time. Wanting flexaril this am but not ordered for the morning. I told him he would have to speak with physician or NP about an increase with Flexaril.  A: Staff will monitor on q 15 minute checks, follow treatment plan, and give meds as ordered. R: Refused to fill out self inventory and did not come to first group meeting this am.

## 2014-02-24 NOTE — Progress Notes (Signed)
D: Pt denies SI/HI/AVH. Pt is pleasant and cooperative. Pt [facility], the only reason he is staying the weekend is to [facility]. Pt craving cigaretts.    A: Pt was offered support and encouragement. Pt was given scheduled medications. Pt was encourage to attend groups. Q 15 minute checks were done for safety.   R:Pt attends groups and interacts well with peers and staff. Pt is taking medication.Pt receptive to treatment and safety maintained on unit.

## 2014-02-24 NOTE — [facility] Group Notes (Signed)
[facility] Group Notes:  (Nursing/MHT/Case Management/Adjunct)  Date:  02/24/2014  Time:  9:55 AM  Type of Therapy:  Psychoeducational Skills  Participation Level:  Did Not Attend  Participation Quality:    Affect:    Cognitive:    Insight:    Engagement in Group:    Modes of Intervention:    Summary of Progress/Problems: Morning wellness  Helyn NumbersJennifer Hundley Westley Blass 02/24/2014, 9:55 AM

## 2014-02-24 NOTE — [facility] Group Notes (Signed)
[facility] Group Notes:  (Nursing/MHT/Case Management/Adjunct)  Date:  02/24/2014  Time:  9:56 AM  Type of Therapy:  Psychoeducational Skills  Participation Level:  Did Not Attend  Participation Quality:    Affect:    Cognitive:    Insight:    Engagement in Group:    Modes of Intervention:    Summary of Progress/Problems: Morning Wellness  [place]ens-IllinoisJennifer Hundley Mads Borgmeyer 02/24/2014, 9:56 AM

## 2014-02-24 NOTE — Progress Notes (Signed)
[facility]  02/24/2014 5:08 PM Joel Smith  MRN:  [id number] Subjective:  Joel Smith states he still has some anxiety, some worry He has an [facility]. He is still worried, concerned. He says he is committed that even if he was to leave before Monday he will not relapse. Not sure what to do Diagnosis:   DSM5: Schizophrenia Disorders:  none Obsessive-Compulsive Disorders:  none Trauma-Stressor Disorders:  none Substance/Addictive Disorders:  Alcohol Related Disorder - Severe (303.90) and Cannabis Use Disorder - Moderate 9304.30) Cocaine Depressive Disorders:  Major Depressive Disorder - Moderate (296.22) Total Time spent with patient: 30 minutes  Axis I: Substance Induced Mood Disorder  ADL's:  Intact  Sleep: Fair  Appetite:  Fair  Suicidal Ideation:  Plan:  denies Intent:  denies Means:  denies Homicidal Ideation:  Plan:  denies Intent:  denies Means:  denies AEB (as evidenced by):  Psychiatric Specialty Exam: Physical Exam  Review of Systems  Constitutional: Positive for malaise/fatigue.  HENT: Negative.   Eyes: Negative.   Respiratory: Negative.   Cardiovascular: Negative.   Gastrointestinal: Negative.   Genitourinary: Negative.   Musculoskeletal: Positive for back pain, joint pain and myalgias.  Skin: Negative.   Neurological: Negative.   Endo/Heme/Allergies: Negative.   Psychiatric/Behavioral: Positive for substance abuse. The patient is nervous/anxious.     Blood pressure 90/54, pulse 101, temperature 98.2 F (36.8 C), temperature source Oral, resp. rate 16, height 5\' 11"  (1.803 m), weight 105.688 kg (233 lb), SpO2 99.00%.Body mass index is 32.51 kg/(m^2).  General Appearance: Fairly Groomed  Patent attorneyye Contact::  Fair  Speech:  Slurred  Volume:  Decreased  Mood:  Anxious and worried  Affect:  anxious, worried  Thought Process:  Coherent and Goal Directed  Orientation:  Full (Time, Place, and Person)  Thought  Content:  symptoms, worries, concerns  Suicidal Thoughts:  No  Homicidal Thoughts:  No  Memory:  Immediate;   Fair Recent;   Fair Remote;   Fair  Judgement:  Fair  Insight:  Present and Shallow  Psychomotor Activity:  Restlessness  Concentration:  Fair  Recall:  [employer]  Fund of Knowledge:NA  Language: Fair  Akathisia:  No  Handed:    AIMS (if indicated):     Assets:  Desire for Improvement  Sleep:  Number of Hours: 5   Musculoskeletal: Strength & Muscle Tone: within normal limits Gait & Station: normal Patient leans: N/A  Current Medications: Current Facility-Administered Medications  Medication Dose Route Frequency Provider Last Rate Last Dose  . acetaminophen (TYLENOL) tablet 650 mg  650 mg Oral Q6H PRN Nanine MeansJamison Lord, NP   650 mg at 02/23/14 2213  . albuterol (PROVENTIL HFA;VENTOLIN HFA) 108 (90 BASE) MCG/ACT inhaler 2 puff  2 puff Inhalation Q6H PRN Nanine MeansJamison Lord, NP   2 puff at 02/23/14 2036  . alum & mag hydroxide-simeth (MAALOX/MYLANTA) 200-200-20 MG/5ML suspension 30 mL  30 mL Oral Q4H PRN Nanine MeansJamison Lord, NP   30 mL at 02/22/14 2241  . cyclobenzaprine (FLEXERIL) tablet 10 mg  10 mg Oral QAC supper Sanjuana KavaAgnes I Nwoko, NP   10 mg at 02/23/14 1715  . cyclobenzaprine (FLEXERIL) tablet 10 mg  10 mg Oral QHS Sanjuana KavaAgnes I Nwoko, NP   10 mg at 02/23/14 2202  . efavirenz-emtricitabine-tenofovir (ATRIPLA) 600-200-300 MG per tablet 1 tablet  1 tablet Oral QHS Nanine MeansJamison Lord, NP   1 tablet at 02/23/14 2202  . hydrOXYzine (ATARAX/VISTARIL) tablet 50 mg  50 mg  Oral QHS PRN,MR X 1 Court Joyharles E Kober, PA-C   50 mg at 02/23/14 2351  . magnesium hydroxide (MILK OF MAGNESIA) suspension 30 mL  30 mL Oral Daily PRN Nanine MeansJamison Lord, NP      . multivitamin with minerals tablet 1 tablet  1 tablet Oral Daily Nanine MeansJamison Lord, NP   1 tablet at 02/24/14 0807  . naproxen (NAPROSYN) tablet 500 mg  500 mg Oral BID WC Mojeed Akintayo   500 mg at 02/24/14 0807  . nicotine (NICODERM CQ - dosed in mg/24 hours) patch 21 mg  21 mg  Transdermal Daily Rachael FeeIrving A Yarelis Ambrosino, MD   21 mg at 02/24/14 0631  . paliperidone (INVEGA) 24 hr tablet 6 mg  6 mg Oral Daily Mojeed Akintayo   6 mg at 02/24/14 0807  . pantoprazole (PROTONIX) EC tablet 40 mg  40 mg Oral Daily Rachael FeeIrving A Oluwadara Gorman, MD   40 mg at 02/24/14 [id number]  . thiamine (B-1) injection 100 mg  100 mg Intramuscular Once Nanine MeansJamison Lord, NP      . thiamine (VITAMIN B-1) tablet 100 mg  100 mg Oral Daily Nanine MeansJamison Lord, NP   100 mg at 02/24/14 0807  . valACYclovir (VALTREX) tablet 1,000 mg  1,000 mg Oral Daily Nanine MeansJamison Lord, NP   1,000 mg at 02/24/14 [id number]    Lab Results: No results found for this or any previous visit (from the past 48 hour(s)).  Physical Findings: AIMS: Facial and Oral Movements Muscles of Facial Expression: None, normal Lips and Perioral Area: None, normal Jaw: None, normal Tongue: None, normal,Extremity Movements Upper (arms, wrists, hands, fingers): None, normal Lower (legs, knees, ankles, toes): None, normal, Trunk Movements Neck, shoulders, hips: None, normal, Overall Severity Severity of abnormal movements (highest score from questions above): None, normal Incapacitation due to abnormal movements: None, normal Patient's awareness of abnormal movements (rate only patient's report): No Awareness,    CIWA:  CIWA-Ar Total: 0 COWS:     Treatment Plan Summary: Daily contact with patient to assess and evaluate symptoms and progress in treatment Medication management  Plan: Supportive approach/coping skills/relapse prevention            Increase the Flexeril 10 TID  Medical Decision Making Problem Points:  Review of psycho-social stressors (1) Data Points:  Review of medication regiment & side effects (2) Review of new medications or change in dosage (2)  I certify that inpatient services furnished can reasonably be expected to improve the patient's condition.   Rachael Feerving A Ashya Nicolaisen 02/24/2014, 5:08 PM

## 2014-02-24 NOTE — [facility] Group Notes (Signed)
[facility] LCSW Group Therapy  02/24/2014 2:45 PM  Type of Therapy:  Group Therapy  Participation Level:  Did Not Attend -pt in room sleeping/did not attend afternoon group.   Joel Smith Smart LCSWA  02/24/2014, 2:45 PM

## 2014-02-24 NOTE — Progress Notes (Signed)
Recreation Therapy Notes  Animal-Assisted Activity/Therapy (AAA/T) Program Checklist/Progress Notes Patient Eligibility Criteria Checklist & Daily Group note for Rec Tx Intervention  Date: 04.16.2015 Time: 2:45pm Location: [address]   AAA/T Program Assumption of Risk Form signed by Patient/ or Parent Legal Guardian yes  Patient is free of allergies or sever asthma yes  Patient reports no fear of animals yes  Patient reports no history of cruelty to animals yes   Patient understands his/her participation is voluntary yes  Behavioral Response: Did not attend.    Sarabelle Genson L Lovey Crupi, LRT/CTRS  Lulu Hirschmann L Josie Mesa 02/24/2014 4:22 PM 

## 2014-02-24 NOTE — Progress Notes (Signed)
Patient ID: Joel BreathCharles L Smith, male   DOB: 02/15/1981, 33 y.o.   MRN: [id number]  D: Pt requested his hs meds early, and was informed by the writer that they would be given after group. When asked about his day pt informed the writer that his bed is available on Monday for rehab. However, pt states he's not sure if he wants to wait til Monday to be discharged. "If we could smoke here, this place would be ok", referring to bhh. Pt stated that patch doesn't work because he's used to smoking 2 to 3 packs daily. Stated,"I can be discharged before Monday, because I'm not craving anything, just cigs"  A: Writer offered the pt nicotine gum, to see if he wanted to switch. 15 min checks cont for safety.  R: Pt remains safe.

## 2014-02-25 DIAGNOSIS — F1020 Alcohol dependence, uncomplicated: Principal | ICD-10-CM

## 2014-02-25 DIAGNOSIS — F329 Major depressive disorder, single episode, unspecified: Secondary | ICD-10-CM

## 2014-02-25 DIAGNOSIS — F1410 Cocaine abuse, uncomplicated: Secondary | ICD-10-CM

## 2014-02-25 MED ORDER — PALIPERIDONE ER 6 MG PO TB24: 6.0000 mg | ORAL_TABLET | Freq: Every day | ORAL | Status: AC

## 2014-02-25 MED ORDER — VALACYCLOVIR HCL 1 G PO TABS: 1000.0000 mg | ORAL_TABLET | Freq: Every day | ORAL | Status: DC

## 2014-02-25 MED ORDER — PALIPERIDONE ER 6 MG PO TB24: 6.0000 mg | ORAL_TABLET | Freq: Every day | ORAL | Status: DC

## 2014-02-25 MED ORDER — CYCLOBENZAPRINE HCL 10 MG PO TABS: 10.0000 mg | ORAL_TABLET | Freq: Three times a day (TID) | ORAL | Status: DC

## 2014-02-25 MED ORDER — VALACYCLOVIR HCL 1 G PO TABS: 1000.0000 mg | ORAL_TABLET | Freq: Every day | ORAL | Status: AC

## 2014-02-25 MED ORDER — HYDROXYZINE HCL 50 MG PO TABS: ORAL_TABLET | ORAL | Status: AC

## 2014-02-25 MED ORDER — HYDROXYZINE HCL 50 MG PO TABS: ORAL_TABLET | ORAL | Status: DC

## 2014-02-25 MED ORDER — EFAVIRENZ-EMTRICITAB-TENOFOVIR 600-200-300 MG PO TABS: 1.0000 | ORAL_TABLET | Freq: Every day | ORAL | Status: DC

## 2014-02-25 MED ORDER — ALBUTEROL SULFATE HFA 108 (90 BASE) MCG/ACT IN AERS: 2.0000 | INHALATION_SPRAY | Freq: Four times a day (QID) | RESPIRATORY_TRACT | Status: DC | PRN
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
## 2014-02-25 NOTE — Tx Team (Signed)
Interdisciplinary Treatment Plan Update (Adult)  Date: 02/25/2014  Time Reviewed:  9:45 AM  Progress in Treatment: Attending groups: Yes Participating in groups:  Yes Taking medication as prescribed:  Yes Tolerating medication:  Yes Family/Significant othe contact made: No, N/A Patient understands diagnosis:  Yes Discussing patient identified problems/goals with staff:  Yes Medical problems stabilized or resolved:  Yes Denies suicidal/homicidal ideation: Yes Issues/concerns per patient self-inventory:  Yes Other:  New problem(s) identified: N/A  Discharge Plan or Barriers: Pt [facility] treatment on Monday and also has [facility] scheduled for outpatient treatment.  Reason for Continuation of Hospitalization: Stable to d/c today  Comments: N/A  Estimated length of stay: D/C today  For review of initial/current patient goals, please see plan of care.  Attendees: Patient:     Family:     Physician:  Dr. Dub MikesLugo 02/25/2014 10:35 AM   Nursing:   Carroll SageBrooks Nichols, RN 02/25/2014 10:35 AM   Clinical Social Worker:  Reyes Ivanhelsea Horton, LCSW 02/25/2014 10:35 AM   Other: Onnie BoerJennifer Clark, RN case manager 02/25/2014 10:35 AM   Other:     Other:     Other:     Other:    Other:    Other:    Other:    Other:    Other:     Scribe for Treatment Team:   Carmina MillerHorton, Aswad Wandrey Nicole, 02/25/2014 , 10:35 AM

## 2014-02-25 NOTE — Progress Notes (Signed)
Adult Psychoeducational Group Note  Date:  02/25/2014 Time:  11:27 AM  Group Topic/Focus:  Early Warning Signs:   The focus of this group is to help patients identify signs or symptoms they exhibit before slipping into an unhealthy state or crisis.  Participation Level:  Did Not Attend  Additional Comments:   Caswell CorwinDana C Madelena Maturin 02/25/2014, 11:27 AM

## 2014-02-25 NOTE — [facility] Group Notes (Signed)
[facility] LCSW Aftercare Discharge Planning Group Note   02/25/2014 8:45 AM  Participation Quality:  Alert, Appropriate and Oriented  Mood/Affect:  Anxious  Depression Rating:  "50"  Anxiety Rating:  "20"  Thoughts of Suicide:  Pt denies SI/HI  Will you contract for safety?   Yes  Current AVH:  Pt denies  Plan for Discharge/Comments:  Pt attended discharge planning group and actively participated in group.  CSW provided pt with today's workbook.  Pt reports being anxious about his d/c plans.  CSW met with pt individually after group to explain the process for admission to [facility].  Pt has a bed date on Monday but wants to d/c today to re certify his Medicaid and get his 30 day supply of meds before going.  Pt states that he plans to stay with his sister in [place] for added support.  No further needs voiced by pt at this time.    Transportation Means: Pt reports access to transportation  Supports: No supports mentioned at this time  Regan Lemming, LCSW 02/25/2014 10:16 AM

## 2014-02-25 NOTE — Progress Notes (Signed)
[facility] Discharge Plan :  Will you be returning to the same living situation after discharge: Yes,  returning home but also plans to stay with sister to prevent relapse At discharge, do you have transportation home?:Yes,  friend will pick pt up or may need a bus pass Do you have the ability to pay for your medications:Yes,  provided pt with prescriptions and pt verbalizes ability to afford meds  Release of information consent forms completed and in the chart;  Patient's signature needed at discharge.  Patient to Follow up at: Follow-up Information   [facility] (Call and schedule for outpatient medication management and therapy after completing inpatient treatment. )    Contact information:   [address] [place], [place] [address] Phone: [phone] Fax: [phone]      [facility] On 02/28/2014. (Arrive at 8:00 am promptly for medical screening and possible admission for further inpatient treatment.  Bring photo ID, insurance card, 30 day supply of all meds and belongings)    Contact information:   [address] [place], [place] [address] Phone: [phone] Fax: [phone]      Patient denies SI/HI:   Yes,  denies SI/HI    Safety Planning and Suicide Prevention discussed:  Yes,  discussed with pt.  N/A to contact family/friend due to no SI on admission.    Deundre Thong N Horton 02/25/2014, 10:46 AM

## 2014-02-25 NOTE — [facility] Suicide Risk Assessment (Signed)
Suicide Risk Assessment  Discharge Assessment     Demographic Factors:  Male  Total Time spent with patient: 45 minutes  Psychiatric Specialty Exam:     Blood pressure 110/77, pulse 80, temperature 97.7 F (36.5 C), temperature source Oral, resp. rate 24, height 5\' 11"  (1.803 m), weight 105.688 kg (233 lb), SpO2 99.00%.Body mass index is 32.51 kg/(m^2).  General Appearance: Fairly Groomed  Patent attorneyye Contact::  Fair  Speech:  Slurred  Volume:  Decreased  Mood:  Euthymic  Affect:  Restricted  Thought Process:  Coherent and Goal Directed  Orientation:  Full (Time, Place, and Person)  Thought Content:  relapse prevention plan  Suicidal Thoughts:  No  Homicidal Thoughts:  No  Memory:  Immediate;   Fair Recent;   Fair Remote;   Fair  Judgement:  Fair  Insight:  Present and Shallow  Psychomotor Activity:  Normal  Concentration:  Fair  Recall:  FiservFair  Fund of Knowledge:NA  Language: Fair  Akathisia:  No  Handed:    AIMS (if indicated):     Assets:  Desire for Improvement Housing  Sleep:  Number of Hours: 5.25    Musculoskeletal: Strength & Muscle Tone: decreased Gait & Station: unsteady Patient leans: N/A   Mental Status Per Nursing Assessment::   On Admission:  NA (denies si/hi/av. )  Current Mental Status by Physician: In full contact with reality. There are no active S/S of withdrawal. He is willing and motivated to pursue further work on his Recovery trough [facility]   Loss Factors: Decline in physical health  Historical Factors: NA  Risk Reduction Factors:   Positive social support and wants to do better  Continued Clinical Symptoms:  Depression:   Comorbid alcohol abuse/dependence Alcohol/Substance Abuse/Dependencies  Cognitive Features That Contribute To Risk:  Closed-mindedness Polarized thinking Thought constriction (tunnel vision)    Suicide Risk:  Minimal: No identifiable suicidal ideation.  Patients presenting with no risk factors but with morbid  ruminations; may be classified as minimal risk based on the severity of the depressive symptoms  Discharge Diagnoses:   AXIS I:  Alcohol Dependence, Cocaine Marijuana Abuse MDD AXIS II:  No diagnosis AXIS III:   Past Medical History  Diagnosis Date  . HIV infection   . Chronic back pain   . Bipolar 1 disorder   . Hemiplegia, unspecified, affecting unspecified side   . Abnormality of gait   . Schizophrenia     pt reports hx of this illness   AXIS IV:  other psychosocial or environmental problems AXIS V:  61-70 mild symptoms  Plan Of Care/Follow-up recommendations:  Activity:  as tolerated Diet:  regular Follow up [facility] Is patient on multiple antipsychotic therapies at discharge:  No   Has Patient had three or more failed trials of antipsychotic monotherapy by history:  No  Recommended Plan for Multiple Antipsychotic Therapies: NA    Rachael FeeIrving A Navreet Bolda 02/25/2014, 11:24 AM

## 2014-02-25 NOTE — Progress Notes (Signed)
Patient ID: Joel Smith, male   DOB: July 02, 1981, 33 y.o.   MRN: [id number] Patient denies si/hi/avh. Pt verbalizes understanding of discharge instructions, prescriptions and follow up appts. Pt signed, but did not have any belongings in a locker. He got his bus pass and his letters that he had requested.  Pt was walked to the lobby and was transported by [employer].

## 2014-02-27 NOTE — ED Provider Notes (Signed)
Medical screening examination/treatment/procedure(s) were performed by non-physician practitioner and as supervising physician I was immediately available for consultation/collaboration.   EKG Interpretation None        Lyanne CoKevin M Danely Bayliss, MD 02/27/14 1408

## 2014-03-01 DIAGNOSIS — F259 Schizoaffective disorder, unspecified: Secondary | ICD-10-CM | POA: Diagnosis not present

## 2014-03-02 NOTE — Progress Notes (Signed)
Patient Discharge Instructions:  After Visit Summary (AVS):   Faxed to:  03/02/14 Discharge Summary Note:   Faxed to:  03/02/14 Psychiatric Admission Assessment Note:   Faxed to:  03/02/14 Suicide Risk Assessment - Discharge Assessment:   Faxed to:  03/02/14 Faxed/Sent to the Next Level Care provider:  03/02/14 [facility] to [facility] @ [phone] Faxed to [facility] @ [phone]  Jerelene ReddenSheena E Summers, 03/02/2014, 3:41 PM

## 2014-03-07 ENCOUNTER — Ambulatory Visit: Payer: Medicare Other | Admitting: Infectious Diseases

## 2014-06-01 ENCOUNTER — Other Ambulatory Visit: Payer: Self-pay | Admitting: *Deleted

## 2014-06-01 ENCOUNTER — Other Ambulatory Visit: Payer: Self-pay | Admitting: Infectious Diseases

## 2014-06-01 ENCOUNTER — Other Ambulatory Visit: Payer: Medicare Other

## 2014-06-01 DIAGNOSIS — Z79899 Other long term (current) drug therapy: Secondary | ICD-10-CM

## 2014-06-01 DIAGNOSIS — B20 Human immunodeficiency virus [HIV] disease: Secondary | ICD-10-CM | POA: Diagnosis not present

## 2014-06-01 DIAGNOSIS — Z113 Encounter for screening for infections with a predominantly sexual mode of transmission: Secondary | ICD-10-CM

## 2014-06-01 DIAGNOSIS — R062 Wheezing: Secondary | ICD-10-CM

## 2014-06-01 LAB — CBC WITH DIFFERENTIAL/PLATELET
Basophils Absolute: 0 10*3/uL (ref 0.0–0.1)
Basophils Relative: 0 % (ref 0–1)
EOS ABS: 0.1 10*3/uL (ref 0.0–0.7)
Eosinophils Relative: 2 % (ref 0–5)
HCT: 42.7 % (ref 39.0–52.0)
HEMOGLOBIN: 14.4 g/dL (ref 13.0–17.0)
Lymphocytes Relative: 40 % (ref 12–46)
Lymphs Abs: 2.3 10*3/uL (ref 0.7–4.0)
MCH: 30.8 pg (ref 26.0–34.0)
MCHC: 33.7 g/dL (ref 30.0–36.0)
MCV: 91.4 fL (ref 78.0–100.0)
MONO ABS: 0.5 10*3/uL (ref 0.1–1.0)
Monocytes Relative: 8 % (ref 3–12)
NEUTROS PCT: 50 % (ref 43–77)
Neutro Abs: 2.9 10*3/uL (ref 1.7–7.7)
Platelets: 271 10*3/uL (ref 150–400)
RBC: 4.67 MIL/uL (ref 4.22–5.81)
RDW: 13.3 % (ref 11.5–15.5)
WBC: 5.8 10*3/uL (ref 4.0–10.5)

## 2014-06-01 MED ORDER — ALBUTEROL SULFATE HFA 108 (90 BASE) MCG/ACT IN AERS: 2.0000 | INHALATION_SPRAY | Freq: Four times a day (QID) | RESPIRATORY_TRACT | Status: AC | PRN

## 2014-06-02 LAB — LIPID PANEL
CHOLESTEROL: 151 mg/dL (ref 0–200)
HDL: 43 mg/dL (ref >39)
LDL Cholesterol: 84 mg/dL (ref 0–99)
Total CHOL/HDL Ratio: 3.5 Ratio
Triglycerides: 118 mg/dL (ref <150)
VLDL: 24 mg/dL (ref 0–40)

## 2014-06-02 LAB — COMPLETE METABOLIC PANEL WITH GFR
ALK PHOS: 65 U/L (ref 39–117)
ALT: 21 U/L (ref 0–53)
AST: 21 U/L (ref 0–37)
Albumin: 4.1 g/dL (ref 3.5–5.2)
BILIRUBIN TOTAL: 0.3 mg/dL (ref 0.2–1.2)
BUN: 13 mg/dL (ref 6–23)
CO2: 23 mEq/L (ref 19–32)
Calcium: 8.9 mg/dL (ref 8.4–10.5)
Chloride: 107 mEq/L (ref 96–112)
Creat: 1.28 mg/dL (ref 0.50–1.35)
GFR, EST NON AFRICAN AMERICAN: 74 mL/min
GFR, Est African American: 85 mL/min
GLUCOSE: 120 mg/dL — AB (ref 70–99)
Potassium: 4.1 mEq/L (ref 3.5–5.3)
Sodium: 137 mEq/L (ref 135–145)
Total Protein: 7.1 g/dL (ref 6.0–8.3)

## 2014-06-02 LAB — T-HELPER CELL (CD4) - (RCID CLINIC ONLY)
CD4 T CELL HELPER: 40 % (ref 33–55)
CD4 T Cell Abs: 1030 /uL (ref 400–2700)

## 2014-06-02 LAB — RPR

## 2014-06-04 LAB — HIV-1 RNA QUANT-NO REFLEX-BLD: HIV 1 RNA Quant: <20 copies/mL (ref <20)

## 2014-06-27 ENCOUNTER — Encounter: Payer: Self-pay | Admitting: Neurology

## 2014-06-30 ENCOUNTER — Ambulatory Visit: Payer: Self-pay | Admitting: Infectious Diseases

## 2014-07-05 ENCOUNTER — Ambulatory Visit (INDEPENDENT_AMBULATORY_CARE_PROVIDER_SITE_OTHER): Payer: Medicare Other | Admitting: Infectious Diseases

## 2014-07-05 ENCOUNTER — Encounter: Payer: Self-pay | Admitting: Infectious Diseases

## 2014-07-05 VITALS — BP 118/81 | HR 74 | Temp 98.0°F | Ht 71.0 in | Wt 239.0 lb

## 2014-07-05 DIAGNOSIS — Z79899 Other long term (current) drug therapy: Secondary | ICD-10-CM

## 2014-07-05 DIAGNOSIS — Z23 Encounter for immunization: Secondary | ICD-10-CM

## 2014-07-05 DIAGNOSIS — G8114 Spastic hemiplegia affecting left nondominant side: Secondary | ICD-10-CM

## 2014-07-05 DIAGNOSIS — B20 Human immunodeficiency virus [HIV] disease: Secondary | ICD-10-CM

## 2014-07-05 DIAGNOSIS — Z113 Encounter for screening for infections with a predominantly sexual mode of transmission: Secondary | ICD-10-CM

## 2014-07-05 DIAGNOSIS — G8110 Spastic hemiplegia affecting unspecified side: Secondary | ICD-10-CM

## 2014-07-05 DIAGNOSIS — F1410 Cocaine abuse, uncomplicated: Secondary | ICD-10-CM

## 2014-07-05 MED ORDER — CYCLOBENZAPRINE HCL 10 MG PO TABS
10.0000 mg | ORAL_TABLET | Freq: Three times a day (TID) | ORAL | Status: DC
Start: 2014-07-05 — End: 2014-08-10

## 2014-07-05 NOTE — Assessment & Plan Note (Signed)
Will try to get him back into pain mgmt. Will refill his flexeril.

## 2014-07-05 NOTE — Assessment & Plan Note (Addendum)
Despite his other issues, he is doing well. He is given condoms, flu shot. He is Hep B immune. Re-iterate adherence. Will see him back in 6 months.
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
## 2014-07-05 NOTE — Assessment & Plan Note (Signed)
Resolved for now. Will get him in with max.

## 2014-07-14 ENCOUNTER — Ambulatory Visit: Payer: Self-pay

## 2014-07-21 DIAGNOSIS — Z23 Encounter for immunization: Secondary | ICD-10-CM

## 2014-08-02 ENCOUNTER — Other Ambulatory Visit: Payer: Self-pay | Admitting: *Deleted

## 2014-08-02 DIAGNOSIS — B20 Human immunodeficiency virus [HIV] disease: Secondary | ICD-10-CM

## 2014-08-02 MED ORDER — EFAVIRENZ-EMTRICITAB-TENOFOVIR 600-200-300 MG PO TABS: ORAL_TABLET | ORAL | Status: DC

## 2014-08-10 ENCOUNTER — Other Ambulatory Visit: Payer: Self-pay | Admitting: *Deleted

## 2014-08-10 ENCOUNTER — Encounter: Payer: Self-pay | Admitting: Neurology

## 2014-08-10 ENCOUNTER — Telehealth: Payer: Self-pay | Admitting: *Deleted

## 2014-08-10 ENCOUNTER — Ambulatory Visit (INDEPENDENT_AMBULATORY_CARE_PROVIDER_SITE_OTHER): Payer: Medicare Other | Admitting: Neurology

## 2014-08-10 DIAGNOSIS — G8110 Spastic hemiplegia affecting unspecified side: Secondary | ICD-10-CM | POA: Diagnosis not present

## 2014-08-10 DIAGNOSIS — B20 Human immunodeficiency virus [HIV] disease: Secondary | ICD-10-CM

## 2014-08-10 DIAGNOSIS — G8114 Spastic hemiplegia affecting left nondominant side: Secondary | ICD-10-CM

## 2014-08-10 MED ORDER — EFAVIRENZ-EMTRICITAB-TENOFOVIR 600-200-300 MG PO TABS: ORAL_TABLET | ORAL | Status: AC

## 2014-08-10 MED ORDER — CYCLOBENZAPRINE HCL 10 MG PO TABS: 10.0000 mg | ORAL_TABLET | Freq: Three times a day (TID) | ORAL | Status: AC

## 2014-08-10 NOTE — Telephone Encounter (Signed)
Patient called asking about the status of his pain referral that was discussed on 8/25. Patient was previously in management with [facility], but was discharged April 2015 d/t a dirty urine screen (positive for cocaine per patient).   [facility], but left AMA before he completed the program.  Since his appointment with Dr. Ninetta LightsHatcher on 8/25, patient did not make an appointment with Jenel LucksJodi Herring, ADS counselor, as recommended.  Patient has not gone to a NA/AA meeting. Patient has not been involved in any outpatient therapy group since last office visit ([facility], hasn't been since March 2015).  Patient stated that he has been out of town helping his grandmother and couldn't do any of that at this time.  RN stated that he would most likely be turned away from a pain management clinic at this time, due to his recent history of drug use and lack of documented follow up with any counseling.  Patient agreed to resume outpatient therapy and to engage in counseling with Lennox LaityJodi in order to pursue a referral.  Patient will call back later for an appointment. Andree CossHowell, Shabreka Coulon M, RN

## 2014-08-11 MED ORDER — ONABOTULINUMTOXINA 100 UNITS IJ SOLR
400.0000 [IU] | Freq: Once | INTRAMUSCULAR | Status: AC
  Administered 2014-08-11: 400 [IU] via INTRAMUSCULAR
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
## 2014-09-22 ENCOUNTER — Encounter: Payer: Self-pay | Admitting: Neurology

## 2014-11-16 ENCOUNTER — Ambulatory Visit: Payer: Medicare Other | Admitting: Neurology

## 2014-12-14 ENCOUNTER — Ambulatory Visit: Payer: Medicare Other | Admitting: Neurology

## 2015-01-04 ENCOUNTER — Encounter: Payer: Self-pay | Admitting: *Deleted

## 2015-01-04 ENCOUNTER — Ambulatory Visit: Payer: Medicare Other | Admitting: Neurology

## 2015-01-04 ENCOUNTER — Telehealth: Payer: Self-pay | Admitting: *Deleted

## 2015-01-04 NOTE — Telephone Encounter (Signed)
No showed Botox.

## 2015-01-05 ENCOUNTER — Encounter: Payer: Self-pay | Admitting: Neurology

## 2015-02-02 ENCOUNTER — Encounter: Payer: Self-pay | Admitting: Neurology

## 2015-02-15 ENCOUNTER — Ambulatory Visit: Payer: Medicare Other | Admitting: Neurology

## 2015-02-15 ENCOUNTER — Telehealth: Payer: Self-pay | Admitting: *Deleted

## 2015-02-15 NOTE — Telephone Encounter (Signed)
No showed Botox injection appointment today.

## 2015-07-25 DIAGNOSIS — G894 Chronic pain syndrome: Secondary | ICD-10-CM | POA: Diagnosis not present

## 2015-07-25 DIAGNOSIS — M791 Myalgia: Secondary | ICD-10-CM | POA: Diagnosis not present

## 2015-07-25 DIAGNOSIS — Z79891 Long term (current) use of opiate analgesic: Secondary | ICD-10-CM | POA: Diagnosis not present
